# Patient Record
Sex: Female | Born: 1990 | Hispanic: Yes | Marital: Married | Smoking: Former smoker
Health system: Southern US, Community
[De-identification: ages and names within clinical notes are randomized; demographics above are authoritative.]

## PROBLEM LIST (undated history)

## (undated) DIAGNOSIS — O98819 Other maternal infectious and parasitic diseases complicating pregnancy, unspecified trimester: Secondary | ICD-10-CM

## (undated) DIAGNOSIS — Z789 Other specified health status: Secondary | ICD-10-CM

## (undated) DIAGNOSIS — A749 Chlamydial infection, unspecified: Secondary | ICD-10-CM

## (undated) HISTORY — PX: NO PAST SURGERIES: SHX2092

## (undated) HISTORY — PX: WISDOM TOOTH EXTRACTION: SHX21

## (undated) HISTORY — DX: Other specified health status: Z78.9

---

## 2014-01-13 ENCOUNTER — Encounter: Payer: Self-pay | Admitting: Obstetrics and Gynecology

## 2014-02-26 ENCOUNTER — Observation Stay: Payer: Self-pay | Admitting: Obstetrics & Gynecology

## 2014-02-27 ENCOUNTER — Inpatient Hospital Stay: Payer: Self-pay | Admitting: Obstetrics and Gynecology

## 2014-02-27 LAB — CBC WITH DIFFERENTIAL/PLATELET
Basophil #: 0.1 10*3/uL (ref 0.0–0.1)
Basophil %: 0.6 %
EOS ABS: 0 10*3/uL (ref 0.0–0.7)
EOS PCT: 0.2 %
HCT: 38.9 % (ref 35.0–47.0)
HGB: 12.7 g/dL (ref 12.0–16.0)
LYMPHS ABS: 1.5 10*3/uL (ref 1.0–3.6)
Lymphocyte %: 10.5 %
MCH: 29.7 pg (ref 26.0–34.0)
MCHC: 32.8 g/dL (ref 32.0–36.0)
MCV: 91 fL (ref 80–100)
Monocyte #: 0.7 x10 3/mm (ref 0.2–0.9)
Monocyte %: 5.1 %
NEUTROS ABS: 12.2 10*3/uL — AB (ref 1.4–6.5)
Neutrophil %: 83.6 %
Platelet: 207 10*3/uL (ref 150–440)
RBC: 4.3 10*6/uL (ref 3.80–5.20)
RDW: 14.4 % (ref 11.5–14.5)
WBC: 14.6 10*3/uL — ABNORMAL HIGH (ref 3.6–11.0)

## 2014-02-28 LAB — GC/CHLAMYDIA PROBE AMP

## 2014-02-28 LAB — HEMATOCRIT: HCT: 35.8 % (ref 35.0–47.0)

## 2014-03-01 LAB — DRUG SCREEN, URINE

## 2015-04-14 NOTE — H&P (Signed)
L&D Evaluation:  History:  HPI 24 yo G1 at 9660w4d by 33wk US derived EDC of 03/02/2014 presenting wtih clear SROM and 7cm cervical dilation.  No VB, +FM   Presents with contractions, leaking fluid   Patient's Medical History No Chronic Illness   Patient's Surgical History none   Medications Pre Natal Vitamins   Allergies shrimp   Social History EtOH   Family History Non-Contributory   ROS:  ROS All systems were reviewed.  HEENT, CNS, GI, GU, Respiratory, CV, Renal and Musculoskeletal systems were found to be normal.   Exam:  Vital Signs stable   Urine Protein not completed   General no apparent distress   Mental Status clear   Abdomen gravid, tender with contractions   Estimated Fetal Weight Average for gestational age   Fetal Position vtx   Back no CVAT   Edema no edema   Pelvic no external lesions, 7cm thick anterior lip 0 station   Mebranes Ruptured   Description clear   FHT normal rate with no decels   Ucx regular   Impression:  Impression active labor   Plan:  Plan EFM/NST, monitor contractions and for cervical change   Comments 1) Labor - expectant management  2) Fetus - category II tracing  3) PNL O positive / ABSC neg / RI / VZI / HBsAg neg / RPR NR / HIV neg / Hgb AA / 1-hr OGTT 97 / GC & CT neg & neg / GBS negative  4) TDAP given 01/03/14  5) THC and EtOH exposure this pregnancy, late entry to care      - UDS on admission  6) Disposition - pending delivery anticipate vaginal   Electronic Signatures: Lorrene ReidStaebler, Mafalda Mcginniss M (MD)  (Signed 26-Mar-15 23:36)  Authored: L&D Evaluation   Last Updated: 26-Mar-15 23:36 by Lorrene ReidStaebler, Nyree Yonker M (MD)

## 2015-06-21 ENCOUNTER — Emergency Department
Admission: EM | Admit: 2015-06-21 | Discharge: 2015-06-21 | Disposition: A | Discharge status: Home / Self Care | Payer: Medicaid Other | Attending: Emergency Medicine | Admitting: Emergency Medicine

## 2015-06-21 ENCOUNTER — Encounter: Payer: Self-pay | Admitting: Emergency Medicine

## 2015-06-21 DIAGNOSIS — J020 Streptococcal pharyngitis: Secondary | ICD-10-CM | POA: Insufficient documentation

## 2015-06-21 DIAGNOSIS — J029 Acute pharyngitis, unspecified: Secondary | ICD-10-CM | POA: Diagnosis present

## 2015-06-21 DIAGNOSIS — R21 Rash and other nonspecific skin eruption: Secondary | ICD-10-CM | POA: Insufficient documentation

## 2015-06-21 DIAGNOSIS — Z720 Tobacco use: Secondary | ICD-10-CM | POA: Insufficient documentation

## 2015-06-21 MED ORDER — LIDOCAINE VISCOUS 2 % MT SOLN: 20.0000 mL | OROMUCOSAL | Status: DC | PRN

## 2015-06-21 MED ORDER — IBUPROFEN 600 MG PO TABS: 600.0000 mg | ORAL_TABLET | Freq: Four times a day (QID) | ORAL | Status: DC | PRN

## 2015-06-21 MED ORDER — PENICILLIN G BENZATHINE 1200000 UNIT/2ML IM SUSP
1.2000 10*6.[IU] | Freq: Once | INTRAMUSCULAR | Status: AC
  Administered 2015-06-21: 1.2 10*6.[IU] via INTRAMUSCULAR
  Filled 2015-06-21: qty 2

## 2015-06-21 NOTE — ED Notes (Signed)
Pt reports sore throat since Thursday. Denies fever. Red rash noted over body.

## 2015-06-21 NOTE — Discharge Instructions (Signed)
Scarlet Fever °Scarlet fever is an infectious disease that can develop with a strep throat. It usually occurs in school-age children and can spread from person to person (contagious). Scarlet fever seldom causes any long-term problems.  °CAUSES °Scarlet fever is caused by the bacteria (Streptococcus pyogenes).  °SYMPTOMS °· Sore throat, fever, and headache. °· Mild abdominal pain. °· Tongue may become red (strawberry tongue). °· Red rash that starts 1 to 2 days after fever begins. Rash starts on face and spreads to rest of body. °· Rash looks and feels like "goose bumps" or sandpaper and may itch. °· Rash lasts 3 to 7 days and then starts to peel. Peeling may last 2 weeks. °DIAGNOSIS °Scarlet fever typically is diagnosed by physical exam and throat culture. Rapid strep testing is often available. °TREATMENT °Antibiotic medicine will be prescribed. It usually takes 24 to 48 hours after beginning antibiotics to start feeling better.  °HOME CARE INSTRUCTIONS °· Rest and get plenty of sleep. °· Take your antibiotics as directed. Finish them even if you start to feel better. °· Gargle a mixture of 1 tsp of salt and 8 oz of water to soothe the throat. °· Drink enough fluids to keep your urine clear or pale yellow. °· While the throat is very sore, eat soft or liquid foods such as milk, milk shakes, ice cream, frozen yogurts, soups, or instant breakfast milk drinks. Cold sport drinks, smoothies, or frozen ice pops are good choices for hydrating. °· Family members who develop a sore throat or fever should see a caregiver. °· Only take over-the-counter or prescription medicines for pain, discomfort, or fever as directed by your caregiver. Do not use aspirin. °· Follow up with your caregiver about test results if necessary. °SEEK MEDICAL CARE IF: °· There is no improvement even after 48 to 72 hours of treatment or the symptoms worsen. °· There is green, yellow-brown, or bloody phlegm. °· There is joint pain or leg  swelling. °· Paleness, weakness, and fast breathing develop. °· There is dry mouth, no urination, or sunken eyes (dehydration). °· There is dark brown or bloody urine. °SEEK IMMEDIATE MEDICAL CARE IF: °· There is drooling or swallowing problems. °· There are breathing problems. °· There is a voice change. °· There is neck pain. °MAKE SURE YOU:  °· Understand these instructions. °· Will watch your condition. °· Will get help right away if you are not doing well or get worse. °Document Released: 11/18/2000 Document Revised: 02/13/2012 Document Reviewed: 05/15/2011 °ExitCare® Patient Information ©2015 [employer]. This information is not intended to replace advice given to you by your health care provider. Make sure you discuss any questions you have with your health care provider. ° °Strep Throat °Strep throat is an infection of the throat caused by a bacteria named Streptococcus pyogenes. Your health care provider may call the infection streptococcal "tonsillitis" or "pharyngitis" depending on whether there are signs of inflammation in the tonsils or back of the throat. Strep throat is most common in children aged 5-15 years during the cold months of the year, but it can occur in people of any age during any season. This infection is spread from person to person (contagious) through coughing, sneezing, or other close contact. °SIGNS AND SYMPTOMS  °· Fever or chills. °· Painful, swollen, red tonsils or throat. °· Pain or difficulty when swallowing. °· White or yellow spots on the tonsils or throat. °· Swollen, tender lymph nodes or "glands" of the neck or under the jaw. °· Red rash   all over the body (rare). °DIAGNOSIS  °Many different infections can cause the same symptoms. A test must be done to confirm the diagnosis so the right treatment can be given. A "rapid strep test" can help your health care provider make the diagnosis in a few minutes. If this test is not available, a light swab of the infected area can be  used for a throat culture test. If a throat culture test is done, results are usually available in a day or two. °TREATMENT  °Strep throat is treated with antibiotic medicine. °HOME CARE INSTRUCTIONS  °· Gargle with 1 tsp of salt in 1 cup of warm water, 3-4 times per day or as needed for comfort. °· Family members who also have a sore throat or fever should be tested for strep throat and treated with antibiotics if they have the strep infection. °· Make sure everyone in your household washes their hands well. °· Do not share food, drinking cups, or personal items that could cause the infection to spread to others. °· You may need to eat a soft food diet until your sore throat gets better. °· Drink enough water and fluids to keep your urine clear or pale yellow. This will help prevent dehydration. °· Get plenty of rest. °· Stay home from school, day care, or work until you have been on antibiotics for 24 hours. °· Take medicines only as directed by your health care provider. °· Take your antibiotic medicine as directed by your health care provider. Finish it even if you start to feel better. °SEEK MEDICAL CARE IF:  °· The glands in your neck continue to enlarge. °· You develop a rash, cough, or earache. °· You cough up green, yellow-brown, or bloody sputum. °· You have pain or discomfort not controlled by medicines. °· Your problems seem to be getting worse rather than better. °· You have a fever. °SEEK IMMEDIATE MEDICAL CARE IF:  °· You develop any new symptoms such as vomiting, severe headache, stiff or painful neck, chest pain, shortness of breath, or trouble swallowing. °· You develop severe throat pain, drooling, or changes in your voice. °· You develop swelling of the neck, or the skin on the neck becomes red and tender. °· You develop signs of dehydration, such as fatigue, dry mouth, and decreased urination. °· You become increasingly sleepy, or you cannot wake up completely. °MAKE SURE YOU: °· Understand  these instructions. °· Will watch your condition. °· Will get help right away if you are not doing well or get worse. °Document Released: 11/18/2000 Document Revised: 04/07/2014 Document Reviewed: 01/20/2011 °ExitCare® Patient Information ©2015 [employer]. This information is not intended to replace advice given to you by your health care provider. Make sure you discuss any questions you have with your health care provider. ° °

## 2015-06-21 NOTE — ED Notes (Signed)
Pt reports understanding of discharge instructions  

## 2015-06-21 NOTE — ED Provider Notes (Signed)
[facility] Emergency Department Provider Note  ____________________________________________  Time seen: Approximately 1:41 PM  I have reviewed the triage vital signs and the nursing notes.   HISTORY  Chief Complaint Sore Throat    HPI Cassandra Dixon is a 24 y.o. female Cassandra Dixon with a three-day history of extremely sore throat and newly developed rash on her body. Patient states that hurts to swallow. Denies any fever chills or body aches.   History reviewed. No pertinent past medical history.  There are no active problems to display for this patient.   History reviewed. No pertinent past surgical history.  Current Outpatient Rx  Name  Route  Sig  Dispense  Refill  . ibuprofen (ADVIL,MOTRIN) 600 MG tablet   Oral   Take 1 tablet (600 mg total) by mouth every 6 (six) hours as needed.   30 tablet   0   . lidocaine (XYLOCAINE) 2 % solution   Mouth/Throat   Use as directed 20 mLs in the mouth or throat as needed for mouth pain.   100 mL   0     Allergies Review of patient's allergies indicates no known allergies.  No family history on file.  Social History History  Substance Use Topics  . Smoking status: Light Tobacco Smoker  . Smokeless tobacco: Never Used  . Alcohol Use: No    Review of Systems Constitutional: No fever/chills Eyes: No visual changes. ENT: Positive sore throat. Cardiovascular: Denies chest pain. Respiratory: Denies shortness of breath. Gastrointestinal: No abdominal pain.  No nausea, no vomiting.  No diarrhea.  No constipation. Genitourinary: Negative for dysuria. Musculoskeletal: Negative for back pain. Skin: Positive for rash.. Neurological: Negative for headaches, focal weakness or numbness.  10-point ROS otherwise negative.  ____________________________________________   PHYSICAL EXAM:  VITAL SIGNS: ED Triage Vitals  Enc Vitals Group     BP 06/21/15 1306 123/73 mmHg     Pulse Rate 06/21/15 1306 91    Resp 06/21/15 1306 18     Temp 06/21/15 1306 98.3 F (36.8 C)     Temp Source 06/21/15 1306 Oral     SpO2 06/21/15 1306 97 %     Weight 06/21/15 1306 160 lb (72.576 kg)     Height 06/21/15 1306  (1.6 m)     Head Cir --      Peak Flow --      Pain Score 06/21/15 1309 8     Pain Loc --      Pain Edu? --      Excl. in GC? --     Constitutional: Alert and oriented. Well appearing and in no acute distress. Eyes: Conjunctivae are normal. PERRL. EOMI. Head: Atraumatic. Nose: No congestion/rhinnorhea. Mouth/Throat: Mucous membranes are moist.  Oropharynx very erythematous and raw. Neck: No stridor.   Cardiovascular: Normal rate, regular rhythm. Grossly normal heart sounds.  Good peripheral circulation. Respiratory: Normal respiratory effort.  No retractions. Lungs CTAB. Gastrointestinal: Soft and nontender. No distention. No abdominal bruits. No CVA tenderness. Musculoskeletal: No lower extremity tenderness nor edema.  No joint effusions. Neurologic:  Normal speech and language. No gross focal neurologic deficits are appreciated. No gait instability. Skin:  Skin is warm, dry and intact. Positive scarlatiniform rash noted on arms trunk and legs. Psychiatric: Mood and affect are normal. Speech and behavior are normal.  ____________________________________________   LABS (all labs ordered are listed, but only abnormal results are displayed)  Labs Reviewed - No data to display   PROCEDURES  Procedure(s) performed: None  Critical Care performed: No  ____________________________________________   INITIAL IMPRESSION / ASSESSMENT AND PLAN / ED COURSE  Pertinent labs & imaging results that were available during my care of the patient were reviewed by me and considered in my medical decision making (see chart for details).  Acute tonsillitis/strep throat pharyngitis. Rx given for Bicillin G1.2 milliunits IM Rx for viscous lidocaine given encouraged Tylenol and ibuprofen for  pain. Patient voices understanding and will return to the ER with any worsening symptomology. She expresses no other emergency medical complaints at this visit. ____________________________________________   FINAL CLINICAL IMPRESSION(S) / ED DIAGNOSES  Final diagnoses:  Pharyngitis, streptococcal, acute      Evangeline DakinCharles M Beers, PA-C 06/21/15 1401  Darci Currentandolph N Brown, MD 06/23/15 [phone]

## 2015-12-06 DIAGNOSIS — A749 Chlamydial infection, unspecified: Secondary | ICD-10-CM

## 2015-12-06 HISTORY — DX: Chlamydial infection, unspecified: A74.9

## 2015-12-06 NOTE — L&D Delivery Note (Signed)
  Obstetrical Delivery Note   Date of Delivery:   09/11/2016 Primary OB:   [facility] Gestational Age/EDD: 5763w1d (Dated by 13 week 6 day ultrasound) Antepartum complications: chlamydia and marijuana use in early pregnancy  Delivered By:   Farrel ConnersGUTIERREZ, Chyla Schlender, CNM  Delivery Type:   spontaneous vaginal delivery  Procedure Details:   Pt. Arrived to unit c/c/+2 and urge to push.  SVD of viable female infant at 1440 over intact perineum.  Shoulders easily delivered with mom lying on right side.  Infant with spontaneous cry, dried and stimulated on mom's chest.  Cord clamped x 2 after pulsation stopped, then cut.  Apgars 8/9.  Spontaneous delivery of the placenta, intact with 3VC.  Uterus firm and U-2 with mild rubra lochia, no clots.  Perineum, vagina, side walls and cervix inspected and intact.  EBL approximately 300 ml.  Mom and infant stable in LDR. Anesthesia:    none Intrapartum complications: None GBS:    Positive- untreated Laceration:    none Episiotomy:    none Placenta:    Via active 3rd stage. To pathology: no Estimated Blood Loss:  350ml  Baby:    Liveborn female, Apgars 8/9    Deane Melick, CNM Leotis Shames/Lauren Zachery ConchFriedman SNM

## 2016-02-02 IMAGING — US US OB DETAIL+14 WK - NRPT [facility]
1 series · 14 of 28 positions shown · non-contrast
Comparison: none

[Series 1: us ob detail+14 wk - nrpt [facility] · 0.28mm/px · 14 of 299 slices shown]
[im 12/299]
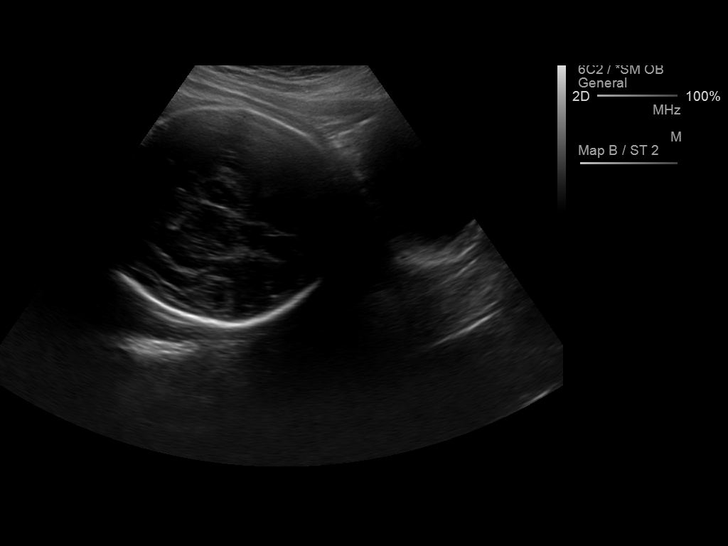
[im 34/299]
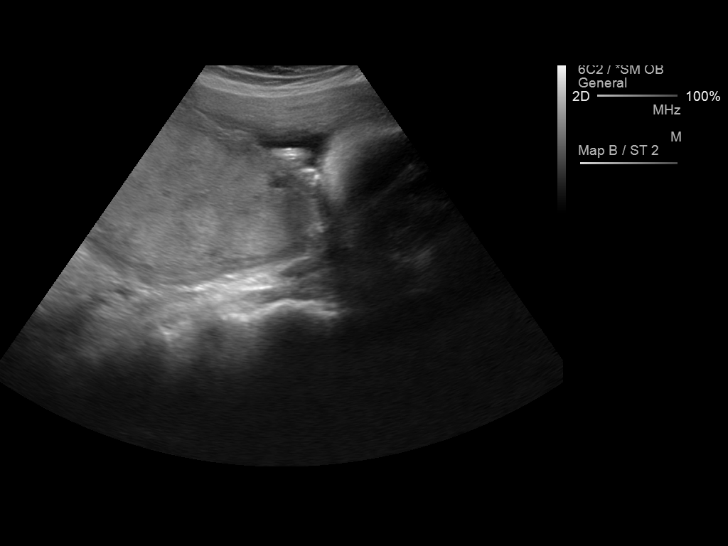
[im 56/299]
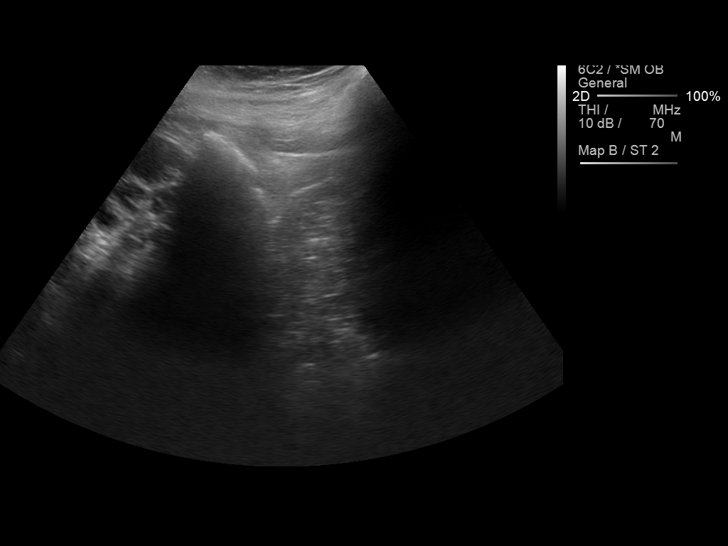
[im 78/299]
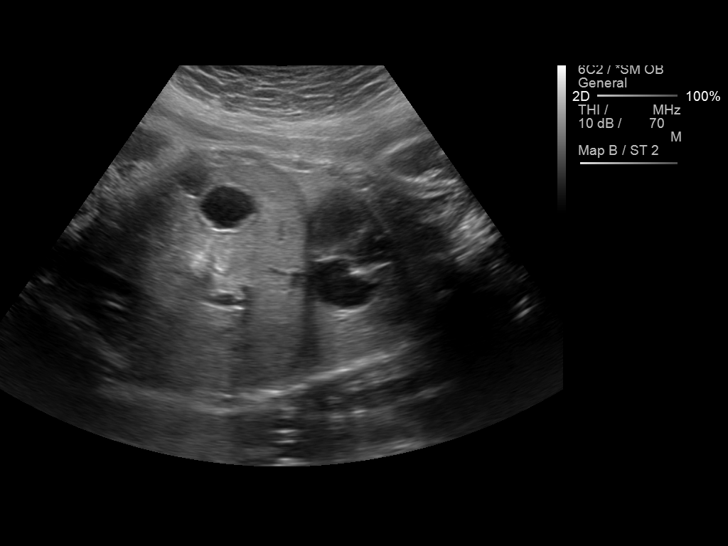
[im 100/299]
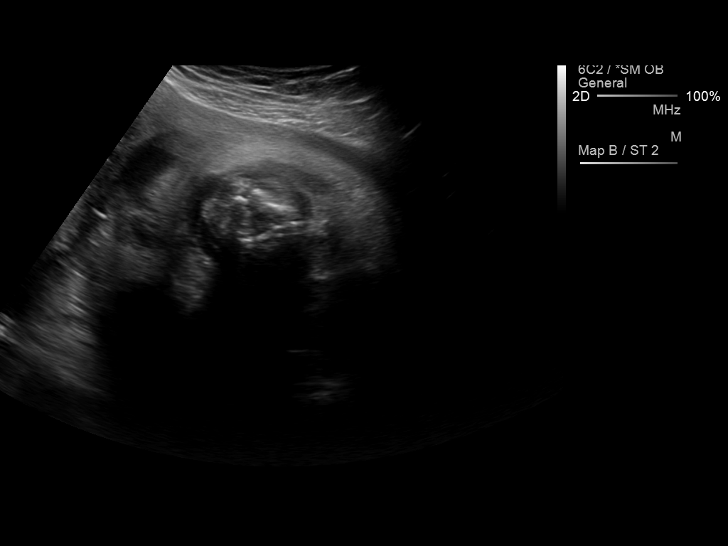
[im 122/299]
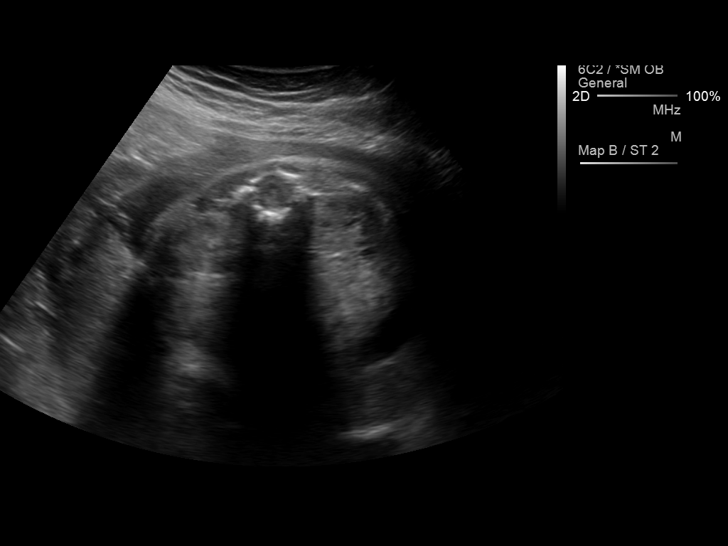
[im 144/299]
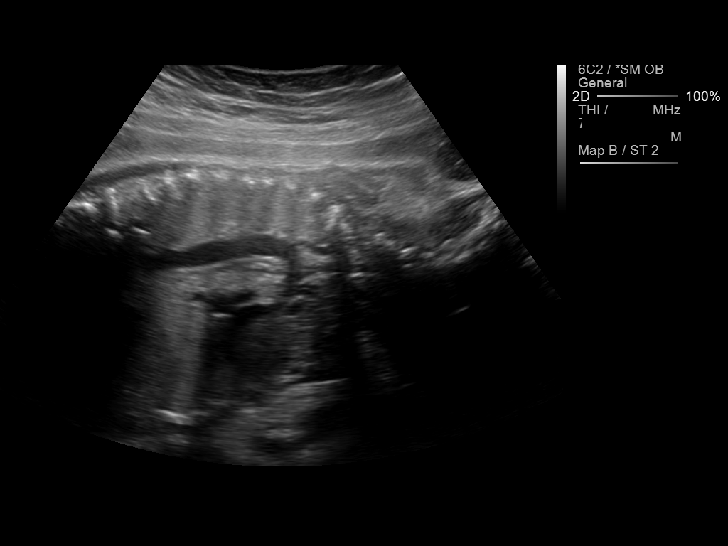
[im 166/299]
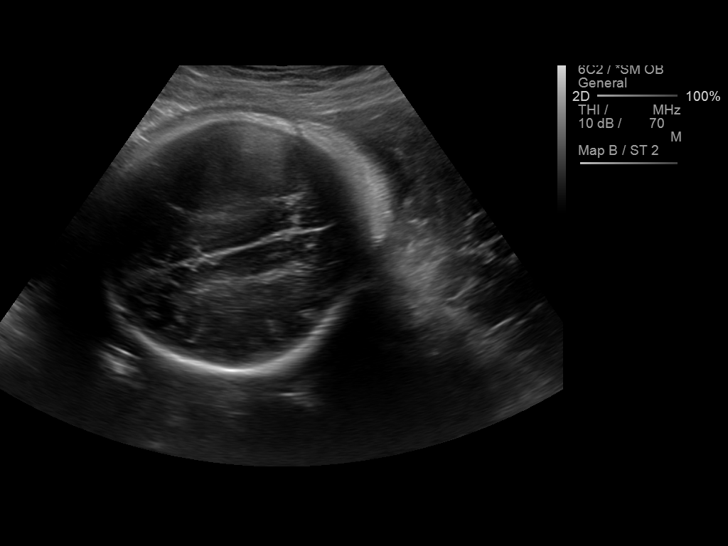
[im 188/299]
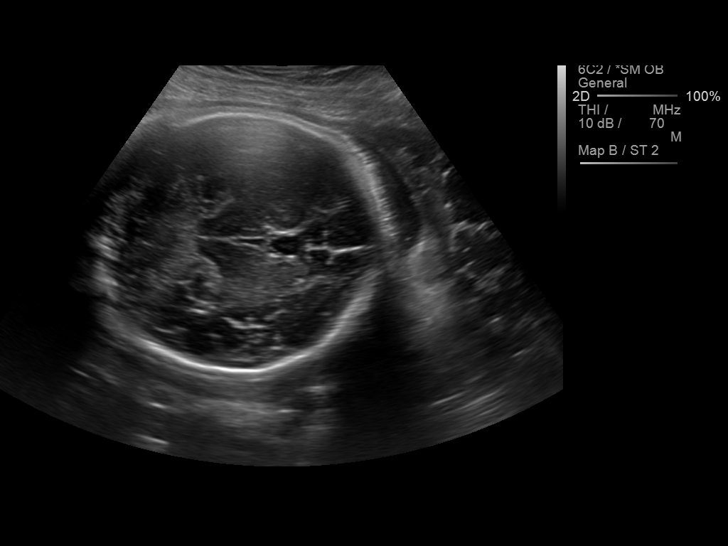
[im 210/299]
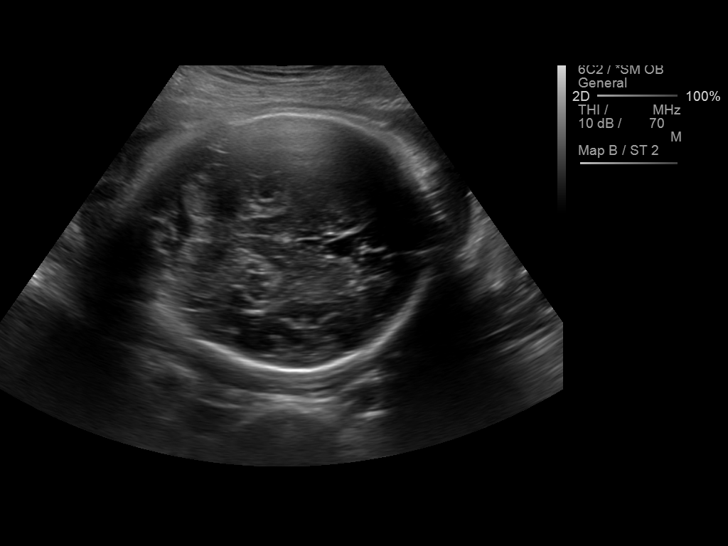
[im 232/299]
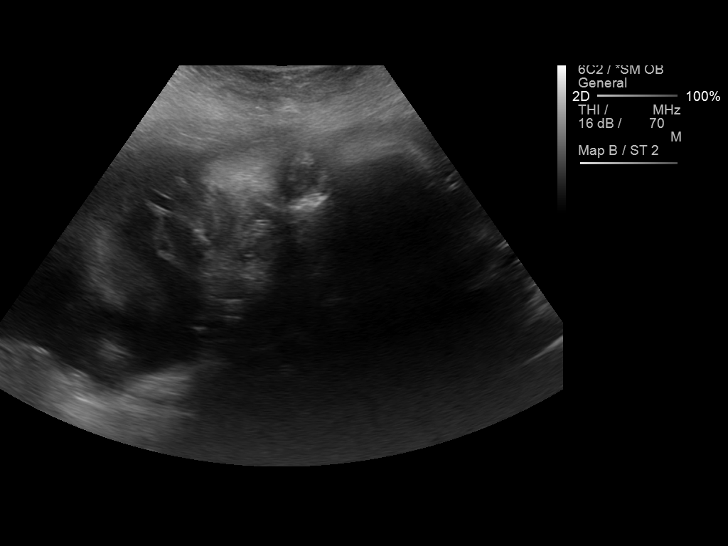
[im 254/299]
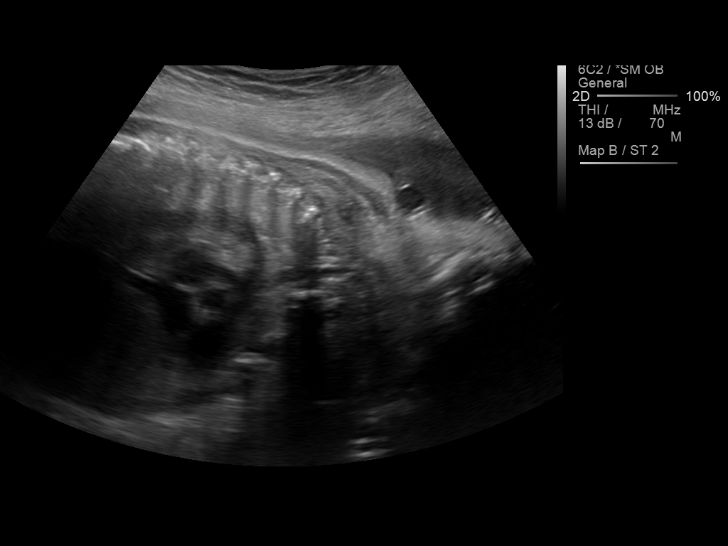
[im 276/299]
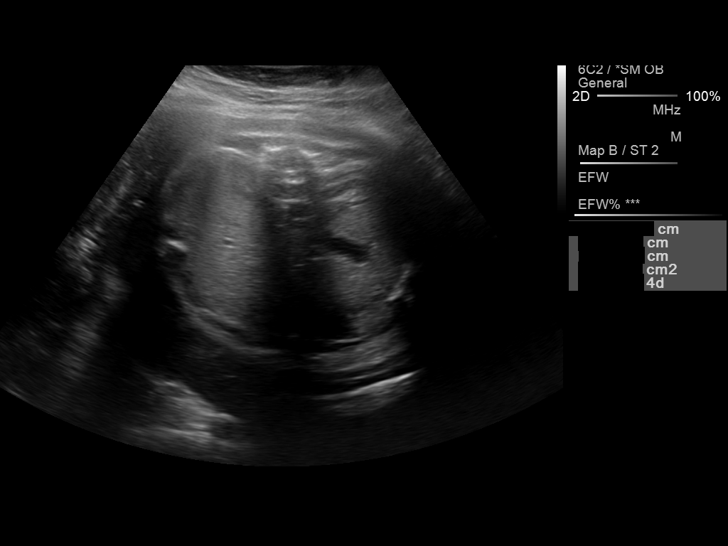
[im 299/299]
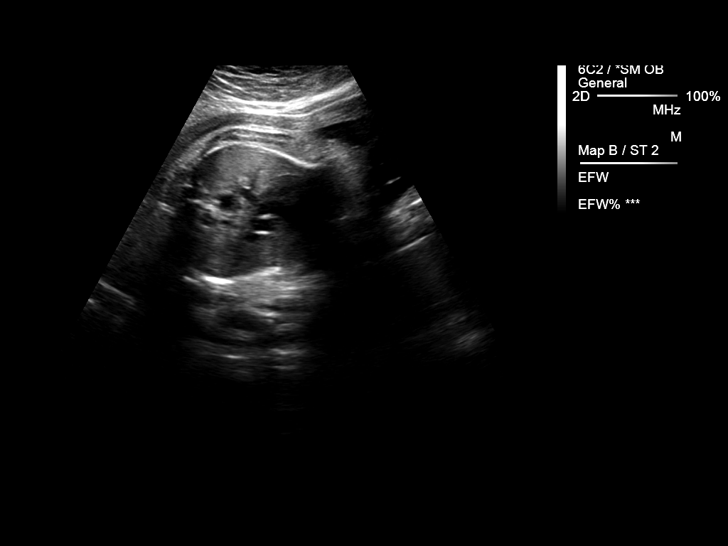

[14 of 28 positions shown; findings below may reference images not displayed]

IMAGES IMPORTED FROM THE [employer] WORKFLOW SYSTEM
NO DICTATION FOR STUDY

## 2016-03-17 LAB — HM PAP SMEAR: HM Pap smear: NORMAL

## 2016-03-24 LAB — OB RESULTS CONSOLE GC/CHLAMYDIA: CHLAMYDIA, DNA PROBE: POSITIVE

## 2016-03-25 LAB — OB RESULTS CONSOLE ANTIBODY SCREEN: Antibody Screen: NEGATIVE

## 2016-03-25 LAB — OB RESULTS CONSOLE VARICELLA ZOSTER ANTIBODY, IGG: VARICELLA IGG: IMMUNE

## 2016-03-25 LAB — OB RESULTS CONSOLE ABO/RH: RH TYPE: POSITIVE

## 2016-03-25 LAB — OB RESULTS CONSOLE RPR: RPR: NONREACTIVE

## 2016-03-25 LAB — OB RESULTS CONSOLE HIV ANTIBODY (ROUTINE TESTING): HIV: NONREACTIVE

## 2016-03-25 LAB — OB RESULTS CONSOLE HEPATITIS B SURFACE ANTIGEN: Hepatitis B Surface Ag: NEGATIVE

## 2016-03-25 LAB — OB RESULTS CONSOLE RUBELLA ANTIBODY, IGM: RUBELLA: IMMUNE

## 2016-04-20 LAB — OB RESULTS CONSOLE GC/CHLAMYDIA: Chlamydia: NEGATIVE

## 2016-07-12 ENCOUNTER — Encounter: Payer: Self-pay | Admitting: Emergency Medicine

## 2016-07-12 ENCOUNTER — Emergency Department
Admission: EM | Admit: 2016-07-12 | Discharge: 2016-07-12 | Disposition: A | Discharge status: Home / Self Care | Payer: Medicaid Other | Attending: Emergency Medicine | Admitting: Emergency Medicine

## 2016-07-12 DIAGNOSIS — O26893 Other specified pregnancy related conditions, third trimester: Secondary | ICD-10-CM | POA: Diagnosis present

## 2016-07-12 DIAGNOSIS — O99333 Smoking (tobacco) complicating pregnancy, third trimester: Secondary | ICD-10-CM | POA: Diagnosis not present

## 2016-07-12 DIAGNOSIS — Z3A29 29 weeks gestation of pregnancy: Secondary | ICD-10-CM | POA: Insufficient documentation

## 2016-07-12 DIAGNOSIS — F17200 Nicotine dependence, unspecified, uncomplicated: Secondary | ICD-10-CM | POA: Insufficient documentation

## 2016-07-12 DIAGNOSIS — L299 Pruritus, unspecified: Secondary | ICD-10-CM | POA: Diagnosis not present

## 2016-07-12 DIAGNOSIS — Z791 Long term (current) use of non-steroidal anti-inflammatories (NSAID): Secondary | ICD-10-CM | POA: Diagnosis not present

## 2016-07-12 DIAGNOSIS — O99713 Diseases of the skin and subcutaneous tissue complicating pregnancy, third trimester: Secondary | ICD-10-CM | POA: Diagnosis not present

## 2016-07-12 DIAGNOSIS — L282 Other prurigo: Secondary | ICD-10-CM

## 2016-07-12 LAB — COMPREHENSIVE METABOLIC PANEL
ALT: 12 U/L — AB (ref 14–54)
ANION GAP: 6 (ref 5–15)
AST: 18 U/L (ref 15–41)
Albumin: 3.4 g/dL — ABNORMAL LOW (ref 3.5–5.0)
Alkaline Phosphatase: 65 U/L (ref 38–126)
BUN: 9 mg/dL (ref 6–20)
CALCIUM: 8.4 mg/dL — AB (ref 8.9–10.3)
CHLORIDE: 106 mmol/L (ref 101–111)
CO2: 25 mmol/L (ref 22–32)
CREATININE: 0.45 mg/dL (ref 0.44–1.00)
Glucose, Bld: 91 mg/dL (ref 65–99)
Potassium: 3.5 mmol/L (ref 3.5–5.1)
SODIUM: 137 mmol/L (ref 135–145)
Total Bilirubin: 0.3 mg/dL (ref 0.3–1.2)
Total Protein: 6.8 g/dL (ref 6.5–8.1)

## 2016-07-12 LAB — CBC WITH DIFFERENTIAL/PLATELET
BASOS PCT: 1 %
Basophils Absolute: 0.1 10*3/uL (ref 0–0.1)
EOS ABS: 0.2 10*3/uL (ref 0–0.7)
Eosinophils Relative: 2 %
HCT: 37.1 % (ref 35.0–47.0)
Hemoglobin: 13.1 g/dL (ref 12.0–16.0)
LYMPHS ABS: 1.8 10*3/uL (ref 1.0–3.6)
Lymphocytes Relative: 21 %
MCH: 31.7 pg (ref 26.0–34.0)
MCHC: 35.3 g/dL (ref 32.0–36.0)
MCV: 89.8 fL (ref 80.0–100.0)
MONO ABS: 0.6 10*3/uL (ref 0.2–0.9)
MONOS PCT: 6 %
NEUTROS PCT: 70 %
Neutro Abs: 6.1 10*3/uL (ref 1.4–6.5)
PLATELETS: 223 10*3/uL (ref 150–440)
RBC: 4.13 MIL/uL (ref 3.80–5.20)
RDW: 13.5 % (ref 11.5–14.5)
WBC: 8.7 10*3/uL (ref 3.6–11.0)

## 2016-07-12 LAB — LIPASE, BLOOD: LIPASE: 22 U/L (ref 11–51)

## 2016-07-12 MED ORDER — DIPHENHYDRAMINE HCL 50 MG/ML IJ SOLN
12.5000 mg | Freq: Once | INTRAMUSCULAR | Status: AC
  Administered 2016-07-12: 12.5 mg via INTRAVENOUS
  Filled 2016-07-12: qty 1

## 2016-07-12 NOTE — Discharge Instructions (Signed)
As we discussed, the OB/GYN doctor (Dr. Bonney AidStaebler) suggested that you do not eat or drink anything this morning.  Call [facility] at about 8:00am (or when they open) and explain that Dr. Bonney AidStaebler told the ER doctor that you should have an appointment this morning to check your fasting bile acids to see if that is what it making you itch.  You can use over-the-counter Benadryl for the itching, but since you are pregnant, it would be best to minimize its use.  The providers [facility] may offer additional suggestions to manage your symptoms.

## 2016-07-12 NOTE — ED Provider Notes (Signed)
[facility] Emergency Department Provider Note  ____________________________________________   First MD Initiated Contact with Patient 07/12/16 [phone]     (approximate)  I have reviewed the triage vital signs and the nursing notes.   HISTORY  Chief Complaint Rash    HPI Cassandra Dixon is a 25 y.o. female  G2 P1 at approximately 5329 weeks gestation who [facility].  She presents for evaluation of severe itching over most of her body.  She reports that it started days ago and that is gradually gotten worse.  She is not sure where it started but now all 4 extremities and her chest and abdomen are both covered with a fine erythematous rash that is severely pruritic.  She states it is not painful.  She has not taken any medications because she is pregnant and does not want to take the wrong thing.  Nothing makes it better and is gradually getting worse.  She denies fever/chills, chest pain, shortness of breath, nausea, vomiting, diarrhea, vaginal bleeding, vaginal discharge.  She states that she has not changed any cleaning products or bath products recently.  She has no allergies of which she is aware.   History reviewed. No pertinent past medical history.  There are no active problems to display for this patient.   History reviewed. No pertinent surgical history.  Prior to Admission medications   Medication Sig Start Date End Date Taking? Authorizing Provider  ibuprofen (ADVIL,MOTRIN) 600 MG tablet Take 1 tablet (600 mg total) by mouth every 6 (six) hours as needed. 06/21/15   Charmayne Sheerharles M Beers, PA-C  lidocaine (XYLOCAINE) 2 % solution Use as directed 20 mLs in the mouth or throat as needed for mouth pain. 06/21/15   Evangeline Dakinharles M Beers, PA-C    Allergies Review of patient's allergies indicates no known allergies.  No family history on file.  Social History Social History  Substance Use Topics  . Smoking status: Light Tobacco Smoker    . Smokeless tobacco: Never Used  . Alcohol use Not on file    Review of Systems Constitutional: No fever/chills Eyes: No visual changes. ENT: No sore throat. Cardiovascular: Denies chest pain. Respiratory: Denies shortness of breath. Gastrointestinal: No abdominal pain.  No nausea, no vomiting.  No diarrhea.  No constipation. Genitourinary: Negative for dysuria. Musculoskeletal: Negative for back pain. Skin: Itching red rash over most skin surfaces Neurological: Negative for headaches, focal weakness or numbness.  10-point ROS otherwise negative.  ____________________________________________   PHYSICAL EXAM:  VITAL SIGNS: ED Triage Vitals [07/12/16 0157]  Enc Vitals Group     BP 105/69     Pulse Rate 68     Resp 18     Temp 97.9 F (36.6 C)     Temp Source Oral     SpO2 100 %     Weight 160 lb (72.6 kg)     Height 5\' 2"  (1.575 m)     Head Circumference      Peak Flow      Pain Score      Pain Loc      Pain Edu?      Excl. in GC?     Constitutional: Alert and oriented. Well appearing and in no acute distress. Eyes: Conjunctivae are normal. PERRL. EOMI. Head: Atraumatic. Nose: No congestion/rhinnorhea. Mouth/Throat: Mucous membranes are moist.  Oropharynx non-erythematous. Neck: No stridor.  No meningeal signs.   Cardiovascular: Normal rate, regular rhythm. Good peripheral circulation. Grossly  normal heart sounds.   Respiratory: Normal respiratory effort.  No retractions. Lungs CTAB. Gastrointestinal: Soft and nontender. No distention.  Musculoskeletal: No lower extremity tenderness nor edema. No gross deformities of extremities. Neurologic:  Normal speech and language. No gross focal neurologic deficits are appreciated.  Skin:  Skin is warm, dry and intact.  Fine sandpaper like papular rash over arms, legs, and torso and abdomen.  No plaques visible.  No cellulitis or open wounds. Psychiatric: Mood and affect are normal. Speech and behavior are  normal.  ____________________________________________   LABS (all labs ordered are listed, but only abnormal results are displayed)  Labs Reviewed  COMPREHENSIVE METABOLIC PANEL - Abnormal; Notable for the following:       Result Value   Calcium 8.4 (*)    Albumin 3.4 (*)    ALT 12 (*)    All other components within normal limits  CBC WITH DIFFERENTIAL/PLATELET  LIPASE, BLOOD   ____________________________________________  EKG  None ____________________________________________  RADIOLOGY   No results found.  ____________________________________________   PROCEDURES  Procedure(s) performed:   Procedures   Critical Care performed: No ____________________________________________   INITIAL IMPRESSION / ASSESSMENT AND PLAN / ED COURSE  Pertinent labs & imaging results that were available during my care of the patient were reviewed by me and considered in my medical decision making (see chart for details).  Allergic reaction most likely, but unknown source.  Cholestasis of pregnancy versus PUPPS also possible.  Checking basic labs to determine if she has an elevated bilirubin or alkaline phosphatase.  I spoke by phone with Dr. Bonney Aid and asked what was the safest/recommended medication for itching during pregnancy and he recommended Benadryl as opposed to hydroxyzine.  Most likely the patient will follow up in the morning at [facility] to have fasting bile acid labs drawn.  Clinical Course  Comment By Time  Patient's itching somewhat improved.  Labs unremarkable.  Still mild rash, but less erythematous than before.  Relayed the information from Dr. Bonney Aid that she should remain NPO and call the clinic to be seen this morning for fasting labs to check bile acids.  Gave usual/customary return precautions. Loleta Rose, MD 08/08 [phone]    ____________________________________________  FINAL CLINICAL IMPRESSION(S) / ED DIAGNOSES  Final diagnoses:  Pruritic rash      MEDICATIONS GIVEN DURING THIS VISIT:  Medications  diphenhydrAMINE (BENADRYL) injection 12.5 mg (12.5 mg Intravenous Given 07/12/16 0452)     NEW OUTPATIENT MEDICATIONS STARTED DURING THIS VISIT:  New Prescriptions   No medications on file      Note:  This document was prepared using Dragon voice recognition software and may include unintentional dictation errors.   Loleta Rose, MD 07/12/16 [phone]

## 2016-07-12 NOTE — ED Triage Notes (Addendum)
Patient ambulatory to triage with steady gait, without difficulty or distress noted; pt reports generalized itching since yesterday with no known cause; [redacted]wks pregnant; FHTs 160 and regular to left mid abd

## 2016-08-31 LAB — OB RESULTS CONSOLE GBS: GBS: POSITIVE

## 2016-09-11 ENCOUNTER — Encounter: Payer: Self-pay | Admitting: Certified Nurse Midwife

## 2016-09-11 ENCOUNTER — Inpatient Hospital Stay
Admission: EM | Admit: 2016-09-11 | Discharge: 2016-09-13 | DRG: 775 | Disposition: A | Discharge status: Home / Self Care | Payer: Medicaid Other | Attending: Certified Nurse Midwife | Admitting: Certified Nurse Midwife

## 2016-09-11 DIAGNOSIS — O99334 Smoking (tobacco) complicating childbirth: Secondary | ICD-10-CM | POA: Diagnosis present

## 2016-09-11 DIAGNOSIS — Z3A38 38 weeks gestation of pregnancy: Secondary | ICD-10-CM

## 2016-09-11 DIAGNOSIS — F1290 Cannabis use, unspecified, uncomplicated: Secondary | ICD-10-CM | POA: Diagnosis present

## 2016-09-11 DIAGNOSIS — Z3403 Encounter for supervision of normal first pregnancy, third trimester: Secondary | ICD-10-CM | POA: Diagnosis present

## 2016-09-11 DIAGNOSIS — O99324 Drug use complicating childbirth: Secondary | ICD-10-CM | POA: Diagnosis present

## 2016-09-11 DIAGNOSIS — O99824 Streptococcus B carrier state complicating childbirth: Secondary | ICD-10-CM | POA: Diagnosis present

## 2016-09-11 HISTORY — DX: Other maternal infectious and parasitic diseases complicating pregnancy, unspecified trimester: O98.819

## 2016-09-11 HISTORY — DX: Chlamydial infection, unspecified: A74.9

## 2016-09-11 LAB — URINE DRUG SCREEN, QUALITATIVE (ARMC ONLY)
AMPHETAMINES, UR SCREEN: NOT DETECTED
Barbiturates, Ur Screen: NOT DETECTED
Benzodiazepine, Ur Scrn: NOT DETECTED
Cannabinoid 50 Ng, Ur ~~LOC~~: NOT DETECTED
Cocaine Metabolite,Ur ~~LOC~~: NOT DETECTED
MDMA (ECSTASY) UR SCREEN: NOT DETECTED
Methadone Scn, Ur: NOT DETECTED
OPIATE, UR SCREEN: NOT DETECTED
PHENCYCLIDINE (PCP) UR S: NOT DETECTED
Tricyclic, Ur Screen: NOT DETECTED

## 2016-09-11 LAB — CHLAMYDIA/NGC RT PCR (ARMC ONLY)
Chlamydia Tr: NOT DETECTED
N gonorrhoeae: NOT DETECTED

## 2016-09-11 MED ORDER — OXYTOCIN 10 UNIT/ML IJ SOLN
INTRAMUSCULAR | Status: AC
  Filled 2016-09-11: qty 1

## 2016-09-11 MED ORDER — SIMETHICONE 80 MG PO CHEW: 80.0000 mg | CHEWABLE_TABLET | ORAL | Status: DC | PRN

## 2016-09-11 MED ORDER — OXYCODONE-ACETAMINOPHEN 5-325 MG PO TABS: 1.0000 | ORAL_TABLET | ORAL | Status: DC | PRN

## 2016-09-11 MED ORDER — IBUPROFEN 600 MG PO TABS
600.0000 mg | ORAL_TABLET | Freq: Four times a day (QID) | ORAL | Status: DC
  Administered 2016-09-11 – 2016-09-12 (×5): 600 mg via ORAL
  Filled 2016-09-11 (×5): qty 1

## 2016-09-11 MED ORDER — IBUPROFEN 600 MG PO TABS
ORAL_TABLET | ORAL | Status: AC
  Administered 2016-09-11: 600 mg via ORAL
  Filled 2016-09-11: qty 1

## 2016-09-11 MED ORDER — LIDOCAINE HCL (PF) 1 % IJ SOLN: 30.0000 mL | INTRAMUSCULAR | Status: DC | PRN

## 2016-09-11 MED ORDER — OXYCODONE-ACETAMINOPHEN 5-325 MG PO TABS: 2.0000 | ORAL_TABLET | ORAL | Status: DC | PRN

## 2016-09-11 MED ORDER — ONDANSETRON HCL 4 MG PO TABS: 4.0000 mg | ORAL_TABLET | ORAL | Status: DC | PRN

## 2016-09-11 MED ORDER — COCONUT OIL OIL
1.0000 "application " | TOPICAL_OIL | Status: DC | PRN
  Filled 2016-09-11: qty 120

## 2016-09-11 MED ORDER — ONDANSETRON HCL 4 MG/2ML IJ SOLN
4.0000 mg | INTRAMUSCULAR | Status: DC | PRN
Start: 2016-09-11 — End: 2016-09-13

## 2016-09-11 MED ORDER — FERROUS SULFATE 325 (65 FE) MG PO TABS
325.0000 mg | ORAL_TABLET | Freq: Every day | ORAL | Status: DC
  Administered 2016-09-12: 325 mg via ORAL
  Filled 2016-09-11: qty 1

## 2016-09-11 MED ORDER — OXYTOCIN 10 UNIT/ML IJ SOLN
10.0000 [IU] | Freq: Once | INTRAMUSCULAR | Status: AC
  Administered 2016-09-11: 10 [IU] via INTRAMUSCULAR

## 2016-09-11 MED ORDER — MEDROXYPROGESTERONE ACETATE 150 MG/ML IM SUSP: 150.0000 mg | INTRAMUSCULAR | Status: DC | PRN

## 2016-09-11 MED ORDER — BENZOCAINE-MENTHOL 20-0.5 % EX AERO: 1.0000 "application " | INHALATION_SPRAY | CUTANEOUS | Status: DC | PRN

## 2016-09-11 MED ORDER — WITCH HAZEL-GLYCERIN EX PADS: 1.0000 "application " | MEDICATED_PAD | CUTANEOUS | Status: DC | PRN

## 2016-09-11 MED ORDER — PRENATAL MULTIVITAMIN CH
1.0000 | ORAL_TABLET | Freq: Every day | ORAL | Status: DC
  Administered 2016-09-12: 1 via ORAL
  Filled 2016-09-11: qty 1

## 2016-09-11 MED ORDER — DIBUCAINE 1 % RE OINT: 1.0000 "application " | TOPICAL_OINTMENT | RECTAL | Status: DC | PRN

## 2016-09-11 NOTE — Discharge Summary (Signed)
Obstetric Discharge Summary Reason for Admission: Labor Prenatal Procedures: none Intrapartum Procedures: spontaneous vaginal delivery Postpartum Procedures: none Complications-Operative and Postpartum: none Hemoglobin  Date Value Ref Range Status  09/12/2016 13.4 12.0 - 16.0 g/dL Final   HGB  Date Value Ref Range Status  02/27/2014 12.7 12.0 - 16.0 g/dL Final   HCT  Date Value Ref Range Status  09/12/2016 39.1 35.0 - 47.0 % Final  02/28/2014 35.8 35.0 - 47.0 % Final   O+/ RI/ VI/ GBS positive (untreated) Tdap UTD  Physical Exam:  General: alert, cooperative and no distress Lochia: appropriate Uterine Fundus: Firm, ML/NT/ U-2 DVT Evaluation: No evidence of DVT seen on physical exam.  Discharge Diagnoses: Term Pregnancy-delivered  Discharge Information: Date: 09/13/2016 Activity: pelvic rest Diet: routine Medications: PNV and Ibuprofen Condition: stable Instructions: refer to practice specific booklet Discharge to: home Follow-up Information    GUTIERREZ, COLLEEN, CNM .   Specialty:  Certified Nurse Midwife Contact information: [address] [place] [id number] [phone]           Newborn Data: Live born female, Alyanna Birth Weight:  7# 3oz APGAR: 8, 9 Breast  Undecided about birth control, information provided in discharge instructions to review prior to 6 week appointment  Home with mother.  Marta AntuBrothers, Chelsey Kimberley, CNM/Lauren Zachery ConchFriedman SNM

## 2016-09-11 NOTE — ED Provider Notes (Signed)
   Time Seen: Approximately *1425 I have reviewed the triage notes  Chief Complaint: No chief complaint on file.   History of Present Illness: Cassandra Dixon is a 25 y.o. female who presents to emergency department at [redacted] weeks pregnant and what seems to be imminent delivery. Patient can't identify how far apart her contractions are denies any prolonged water. Patient appears to be in significant discomfort with her contractions. She has had one other previous delivery without complication. No past medical history on file.  There are no active problems to display for this patient.   No past surgical history on file.  No past surgical history on file.  Current Outpatient Rx  . Order #: [id number] Class: Print  . Order #: [id number] Class: Print    Allergies:  Review of patient's allergies indicates no known allergies.  Family History: No family history on file.  Social History: Social History  Substance Use Topics  . Smoking status: Light Tobacco Smoker  . Smokeless tobacco: Never Used  . Alcohol use Not on file     Review of Systems:   10 point review of systems was performed and was otherwise negative:  Constitutional: No fever Eyes: No visual disturbances ENT: No sore throat, ear pain Cardiac: No chest pain Respiratory: No shortness of breath, wheezing, or stridor Abdomen: Lower middle abdominal pain Endocrine: No weight loss, No night sweats Extremities: No peripheral edema, cyanosis Skin: No rashes, easy bruising Neurologic: No focal weakness, trouble with speech or swollowing Urologic: No dysuria, Hematuria, or urinary frequency   Physical Exam:  ED Triage Vitals  Enc Vitals Group     BP      Pulse      Resp      Temp      Temp src      SpO2      Weight      Height      Head Circumference      Peak Flow      Pain Score      Pain Loc      Pain Edu?      Excl. in GC?     General: Awake , Alert , and Oriented times 3; GCS 15 Significant  discomfort Abdomen: Appears somewhat of a third trimester pregnancy        Extremities: 2 plus symmetric pulses. No edema, clubbing or cyanosis Neurologic: normal ambulation, Motor symmetric without deficits, sensory intact Skin: warm, dry, no rashes Clinic bedside exam with nursing staff present shows the patient to be fully dilated in the head with approximately 3 cm inside the vaginal vault.   ED Course:  Patient was rushed to labor and delivery as the emergency department and notified Dr. Jean RosenthalJackson who [facility]. Patient was taken to labor and delivery area for the imminent delivery today on the stretcher. I contacted the patient to labor and delivery area and unfortunately numerous staff waiting on arrival. The patient went on to have a normal vaginal delivery after a single push. Delivery was performed by the labor and delivery unit Clinical Course     Assessment: Imminent delivery      Plan:  Labor and delivery            Jennye MoccasinBrian S Natha Guin, MD 09/11/16 1456

## 2016-09-11 NOTE — H&P (Signed)
OB History & Physical   History of Present Illness:  Chief Complaint:  Pt. Brought up from emergency room complete/comlete/+2 with urge to push.   HPI:  Gevena Martancy M Garcia is a 25 y.o. G1P0 female at 678w1d dated by 13 week 6 day ultrasound.  Her pregnancy has been complicated by positive chlamydia and marijuana use in early pregnancy..  She presents to L&D in second stage of labor.  Contractions started at about 10 am am, they started getting stronger around noon and she took a bath.  The contractions continued to increase in strength, so she had her mom drive her to the hospital.  She claims to have been pushing in the car.  Delivered shortly after presenting to unit.    Prenatal care site: [facility].  Plans to breastfeed, breastfed 518 year old son.  Tdap UTD 07/27/2016.  Plans to use depo for contraception.     Maternal Medical History:   Past Medical History:  Diagnosis Date  . Chlamydia infection affecting pregnancy 2017    Past Surgical History:  Procedure Laterality Date  . NO PAST SURGERIES      No Known Allergies  Prior to Admission medications   Medication Sig Start Date End Date Taking? Authorizing Provider  Prenatal Vit-Fe Fumarate-FA (MULTIVITAMIN-PRENATAL) 27-0.8 MG TABS tablet Take 1 tablet by mouth daily at 12 noon.   Yes Historical Provider, MD   Social History: She  reports that she has been smoking.  She has never used smokeless tobacco. She reports that she uses drugs, including Marijuana.  Family History: family history is not on file.   Review of Systems: Negative x 10 systems reviewed except as noted in the HPI.    Physical Exam:  Vitals: 118/65 post-partum General: no acute distress, post-partum HEENT: normocephalic, atraumatic  Heart: regular rate & rhythm.  No murmurs Lungs: clear to auscultation bilaterally Abdomen: Firm, NT, ML, U-2 Pelvic: Baby skin to skin with mom Extremities: non-tender, symmetric, trace edema bilaterally.   Neurologic: Alert & oriented x 3.    Pertinent Results:  Prenatal Labs: Blood type/Rh O positive  Antibody screen Negative  Rubella Varicella Immune Immune  RPR Non reactive  HBsAg Non reactive  HIV Negative  GC Negative  Chlamydia Positive 03/16/16, Negative TOC, most recent 08/31/16  Genetic screening N/A  1 hour GTT 69  3 hour GTT N/A  GBS Positive on 08/31/16    Assessment:  Gevena Martancy M Garcia is a 25 y.o. G1P0 female at 568w1d with rapid delivery on arrival to unit.  Now post-partum.     Plan:  1. Admit to Labor & Delivery. 2. CBC in am 3. GBS Positive, no time to treat.  Will inform pediatrics.   4. Consents obtained post-partum. 5. Regular diet 6. O+/ RI/ VI/ GBS Pos, Tdap UTD 7. Breastfeeding 8. Depo-provera    Cleotis Sparr  09/11/2016 3:12 PM

## 2016-09-12 LAB — CBC
HCT: 39.1 % (ref 35.0–47.0)
Hemoglobin: 13.4 g/dL (ref 12.0–16.0)
MCH: 30.8 pg (ref 26.0–34.0)
MCHC: 34.2 g/dL (ref 32.0–36.0)
MCV: 89.9 fL (ref 80.0–100.0)
PLATELETS: 213 10*3/uL (ref 150–440)
RBC: 4.35 MIL/uL (ref 3.80–5.20)
RDW: 14.3 % (ref 11.5–14.5)
WBC: 10.8 10*3/uL (ref 3.6–11.0)

## 2016-09-12 NOTE — Care Management (Signed)
RNCM consult received for information regarding Medicaid. Patient has Medicaid and should follow up with her case worker at [facility] with any questions. Most medications are covered and should provide a provider to follow up with after hospitalization. Please call with any other questions RNCM [phone].

## 2016-09-12 NOTE — Progress Notes (Signed)
Post Partum Day 1 Subjective: Doing well, no complaints.  Tolerating regular diet, pain with PO meds, voiding and ambulating without difficulty.  No CP SOB F/C N/V or leg pain No HA, change of vision, RUQ/epigastric pain  Objective: BP 101/60 (BP Location: Left Arm)   Pulse 60   Temp 98.2 F (36.8 C) (Oral)   Resp 18   Ht 5\' 3"  (1.6 m)   Wt 183 lb (83 kg)   LMP 03/25/2016   SpO2 98%   Breastfeeding   BMI 32.42 kg/m   Physical Exam:  General: NAD CV: RRR Pulm: nl effort, CTABL Lochia: moderate Uterine Fundus: fundus firm and below umbilicus DVT Evaluation: no cords, ttp LEs    Recent Labs  09/12/16 0550  HGB 13.4  HCT 39.1  WBC 10.8  PLT 213    Assessment/Plan: 25 y.o. G2P2002 postpartum day # 1  1. Continue routine postpartum care 2. O+, Rubella Immune, Varicella Immune 3. TDAP UTD 4. Breastfeeding/Depo 5. Disposition: Discharge to home tomorrow   Tresea MallGLEDHILL,Brandell Maready, CNM

## 2016-09-13 LAB — RPR: RPR Ser Ql: NONREACTIVE

## 2016-09-13 MED ORDER — IBUPROFEN 600 MG PO TABS: 600.0000 mg | ORAL_TABLET | Freq: Four times a day (QID) | ORAL | 0 refills | Status: DC | PRN

## 2016-09-13 NOTE — Discharge Instructions (Signed)
Please call your doctor or return to the ER if you experience any chest pains, shortness of breath, fever greater than 101, any heavy bleeding or large clots, and foul smelling vaginal discharge, any worsening abdominal pain & cramping that is not controlled by pain medication, or any signs of post partum depression.  No tampons, enemas, douches, or sexual intercourse for 6 weeks.  Also avoid tub baths, hot tubs, or swimming for 6 weeks.   Vaginal Delivery, Care After Refer to this sheet in the next few weeks. These discharge instructions provide you with information on caring for yourself after delivery. Your caregiver may also give you specific instructions. Your treatment has been planned according to the most current medical practices available, but problems sometimes occur. Call your caregiver if you have any problems or questions after you go home. HOME CARE INSTRUCTIONS 1. Take over-the-counter or prescription medicines only as directed by your caregiver or pharmacist. 2. Do not drink alcohol, especially if you are breastfeeding or taking medicine to relieve pain. 3. Do not smoke tobacco. 4. Continue to use good perineal care. Good perineal care includes: 1. Wiping your perineum from back to front 2. Keeping your perineum clean. 3. You can do sitz baths twice a day, to help keep this area clean 5. Do not use tampons, douche or have sex for 6 weeks 6. Shower only and avoid sitting in submerged water, aside from sitz baths 7. Wear a well-fitting bra that provides breast support. 8. Eat healthy foods. 9. Drink enough fluids to keep your urine clear or pale yellow. 10. Eat high-fiber foods such as whole grain cereals and breads, brown rice, beans, and fresh fruits and vegetables every day. These foods may help prevent or relieve constipation. 11. Avoid constipation with high fiber foods or medications, such as miralax or metamucil 12. Follow your caregiver's recommendations regarding  resumption of activities such as climbing stairs, driving, lifting, exercising, or traveling. 13. Talk to your caregiver about resuming sexual activities. Resumption of sexual activities after 6 weeks  is dependent upon your risk of infection, your rate of healing, and your comfort and desire to resume sexual activity. 14. Try to have someone help you with your household activities and your newborn for at least a few days after you leave the hospital. 15. Rest as much as possible. Try to rest or take a nap when your newborn is sleeping. 16. Increase your activities gradually. 17. Keep all of your scheduled postpartum appointments. It is very important to keep your scheduled follow-up appointments. At these appointments, your caregiver will be checking to make sure that you are healing physically and emotionally. SEEK MEDICAL CARE IF:   You are passing large clots from your vagina. Save any clots to show your caregiver.  You have a foul smelling discharge from your vagina.  You have trouble urinating.  You are urinating frequently.  You have pain when you urinate.  You have a change in your bowel movements.  You have increasing redness, pain, or swelling near your vaginal incision (episiotomy) or vaginal tear.  You have pus draining from your episiotomy or vaginal tear.  Your episiotomy or vaginal tear is separating.  You have painful, hard, or reddened breasts.  You have a severe headache.  You have blurred vision or see spots.  You feel sad or depressed.  You have thoughts of hurting yourself or your newborn.  You have questions about your care, the care of your newborn, or medicines.  You are  dizzy or light-headed.  You have a rash.  You have nausea or vomiting.  You were breastfeeding and have not had a menstrual period within 12 weeks after you stopped breastfeeding.  You are not breastfeeding and have not had a menstrual period by the 12th week after  delivery.  You have a fever of 100.5 or more. SEEK IMMEDIATE MEDICAL CARE IF:   You have persistent pain.  You have chest pain.  You have shortness of breath.  You faint.  You have leg pain.  You have stomach pain.  Your vaginal bleeding saturates two or more sanitary pads in 1 hour. MAKE SURE YOU:   Understand these instructions.  Will watch your condition.  Will get help right away if you are not doing well or get worse. Document Released: 11/18/2000 Document Revised: 04/07/2014 Document Reviewed: 07/18/2012 [facility] Patient Information 2015 [place], [place]. This information is not intended to replace advice given to you by your health care provider. Make sure you discuss any questions you have with your health care provider.  Sitz Bath A sitz bath is a warm water bath taken in the sitting position. The water covers only the hips and butt (buttocks). We recommend using one that fits in the toilet, to help with ease of use and cleanliness. It may be used for either healing or cleaning purposes. Sitz baths are also used to relieve pain, itching, or muscle tightening (spasms). The water may contain medicine. Moist heat will help you heal and relax.  HOME CARE  Take 3 to 4 sitz baths a day. 18. Fill the bathtub half-full with warm water. 19. Sit in the water and open the drain a little. 20. Turn on the warm water to keep the tub half-full. Keep the water running constantly. 21. Soak in the water for 15 to 20 minutes. 22. After the sitz bath, pat the affected area dry. GET HELP RIGHT AWAY IF: You get worse instead of better. Stop the sitz baths if you get worse. MAKE SURE YOU:  Understand these instructions.  Will watch your condition.  Will get help right away if you are not doing well or get worse. Document Released: 12/29/2004 Document Revised: 08/15/2012 Document Reviewed: 03/21/2011 [facility] Patient Information 2015 [place], [place]. This information is not intended to  replace advice given to you by your health care provider. Make sure you discuss any questions you have with your health care provider.

## 2016-09-13 NOTE — Progress Notes (Signed)
D/C order from MD.  Reviewed d/c instructions and prescriptions with patient and answered any questions.  Patient d/c home with infant via wheelchair by nursing/auxillary. 

## 2016-12-05 NOTE — L&D Delivery Note (Signed)
Date of delivery: 07/17/17 Estimated Date of Delivery: 07/23/17 LMP: 10/27/16 EGA: 39.1  At 9:10 AM a viable female was delivered via Vaginal, Spontaneous Delivery.  APGAR: pending; weight pending.   Placenta status: spontaneous, intact.  Cord: 3vessels with the following complications: none apparent.  Cord pH: not collected.  Anesthesia:  None Episiotomy: None Lacerations: None Suture Repair: n/a Est. Blood Loss (mL):  100cc  Mom presented to the ED with complaints of contractions and urge to push.  Dr. Don PerkingVeronese - ED Dept - palpated the head at +2 station.  She was brought up to L&D on a stretcher and proceeded to push with delivery of head + body through intact perineum.  No nuchal, no shoulder dystocia, No restitution.  Cord was doubly clamped and cut and handed to awaiting pediatric team.  Placenta was delivered intact, spontaneously, immediately afterward.  No lacerations.  IV pitocin given for hemorrhage prophylaxis.   Mom to postpartum.  Baby to Couplet care / Skin to Skin.  Cassandra Dixon 07/17/2017, 9:45 AM

## 2017-05-04 ENCOUNTER — Other Ambulatory Visit: Payer: Self-pay | Admitting: Physician Assistant

## 2017-05-04 DIAGNOSIS — Z3482 Encounter for supervision of other normal pregnancy, second trimester: Secondary | ICD-10-CM

## 2017-05-04 LAB — HM HIV SCREENING LAB: HM HIV Screening: NEGATIVE

## 2017-05-04 LAB — OB RESULTS CONSOLE HIV ANTIBODY (ROUTINE TESTING): HIV: NONREACTIVE

## 2017-05-05 LAB — OB RESULTS CONSOLE RPR: RPR: NONREACTIVE

## 2017-05-05 LAB — OB RESULTS CONSOLE GC/CHLAMYDIA
Chlamydia: NEGATIVE
GC PROBE AMP, GENITAL: NEGATIVE

## 2017-05-05 LAB — OB RESULTS CONSOLE HEPATITIS B SURFACE ANTIGEN: HEP B S AG: NEGATIVE

## 2017-05-05 LAB — OB RESULTS CONSOLE VARICELLA ZOSTER ANTIBODY, IGG: VARICELLA IGG: IMMUNE

## 2017-05-05 LAB — OB RESULTS CONSOLE RUBELLA ANTIBODY, IGM: RUBELLA: IMMUNE

## 2017-05-15 ENCOUNTER — Ambulatory Visit: Payer: Medicaid Other

## 2017-05-15 ENCOUNTER — Ambulatory Visit
Admission: RE | Admit: 2017-05-15 | Discharge: 2017-05-15 | Disposition: A | Discharge status: Home / Self Care | Payer: Medicaid Other | Source: Ambulatory Visit | Attending: Physician Assistant | Admitting: Physician Assistant

## 2017-05-15 DIAGNOSIS — Z3482 Encounter for supervision of other normal pregnancy, second trimester: Secondary | ICD-10-CM

## 2017-07-13 LAB — OB RESULTS CONSOLE GBS: GBS: POSITIVE

## 2017-07-13 LAB — OB RESULTS CONSOLE GC/CHLAMYDIA
CHLAMYDIA, DNA PROBE: NEGATIVE
Gonorrhea: NEGATIVE

## 2017-07-17 ENCOUNTER — Inpatient Hospital Stay
Admission: AD | Admit: 2017-07-17 | Discharge: 2017-07-19 | DRG: 775 | Disposition: A | Discharge status: Home / Self Care | Payer: Medicaid Other | Source: Ambulatory Visit | Attending: Obstetrics & Gynecology | Admitting: Obstetrics & Gynecology

## 2017-07-17 DIAGNOSIS — Z87891 Personal history of nicotine dependence: Secondary | ICD-10-CM

## 2017-07-17 DIAGNOSIS — Z3A39 39 weeks gestation of pregnancy: Secondary | ICD-10-CM | POA: Diagnosis not present

## 2017-07-17 DIAGNOSIS — Z3493 Encounter for supervision of normal pregnancy, unspecified, third trimester: Secondary | ICD-10-CM | POA: Diagnosis present

## 2017-07-17 DIAGNOSIS — O99324 Drug use complicating childbirth: Secondary | ICD-10-CM | POA: Diagnosis present

## 2017-07-17 DIAGNOSIS — F1290 Cannabis use, unspecified, uncomplicated: Secondary | ICD-10-CM | POA: Diagnosis present

## 2017-07-17 DIAGNOSIS — O99824 Streptococcus B carrier state complicating childbirth: Secondary | ICD-10-CM | POA: Diagnosis present

## 2017-07-17 LAB — CBC
HEMATOCRIT: 40.9 % (ref 35.0–47.0)
HEMOGLOBIN: 13.7 g/dL (ref 12.0–16.0)
MCH: 30 pg (ref 26.0–34.0)
MCHC: 33.5 g/dL (ref 32.0–36.0)
MCV: 89.3 fL (ref 80.0–100.0)
Platelets: 205 10*3/uL (ref 150–440)
RBC: 4.57 MIL/uL (ref 3.80–5.20)
RDW: 14.6 % — AB (ref 11.5–14.5)
WBC: 14.3 10*3/uL — ABNORMAL HIGH (ref 3.6–11.0)

## 2017-07-17 LAB — URINE DRUG SCREEN, QUALITATIVE (ARMC ONLY)
Amphetamines, Ur Screen: NOT DETECTED
Barbiturates, Ur Screen: NOT DETECTED
Benzodiazepine, Ur Scrn: NOT DETECTED
CANNABINOID 50 NG, UR ~~LOC~~: POSITIVE — AB
Cocaine Metabolite,Ur ~~LOC~~: NOT DETECTED
MDMA (ECSTASY) UR SCREEN: NOT DETECTED
Methadone Scn, Ur: NOT DETECTED
OPIATE, UR SCREEN: NOT DETECTED
PHENCYCLIDINE (PCP) UR S: NOT DETECTED
Tricyclic, Ur Screen: NOT DETECTED

## 2017-07-17 LAB — TYPE AND SCREEN
ABO/RH(D): O POS
Antibody Screen: NEGATIVE

## 2017-07-17 MED ORDER — OXYTOCIN 40 UNITS IN LACTATED RINGERS INFUSION - SIMPLE MED: 2.5000 [IU]/h | INTRAVENOUS | Status: DC

## 2017-07-17 MED ORDER — ONDANSETRON HCL 4 MG PO TABS: 4.0000 mg | ORAL_TABLET | ORAL | Status: DC | PRN

## 2017-07-17 MED ORDER — COCONUT OIL OIL: 1.0000 "application " | TOPICAL_OIL | Status: DC | PRN

## 2017-07-17 MED ORDER — OXYTOCIN BOLUS FROM INFUSION
500.0000 mL | Freq: Once | INTRAVENOUS | Status: AC
  Administered 2017-07-17: 500 mL via INTRAVENOUS

## 2017-07-17 MED ORDER — IBUPROFEN 600 MG PO TABS
600.0000 mg | ORAL_TABLET | Freq: Four times a day (QID) | ORAL | Status: DC
  Administered 2017-07-17 – 2017-07-18 (×3): 600 mg via ORAL
  Filled 2017-07-17 (×4): qty 1

## 2017-07-17 MED ORDER — PRENATAL MULTIVITAMIN CH
1.0000 | ORAL_TABLET | Freq: Every day | ORAL | Status: DC
  Administered 2017-07-17 – 2017-07-18 (×2): 1 via ORAL
  Filled 2017-07-17 (×2): qty 1

## 2017-07-17 MED ORDER — DIBUCAINE 1 % RE OINT
1.0000 "application " | TOPICAL_OINTMENT | RECTAL | Status: DC | PRN
  Filled 2017-07-17: qty 28

## 2017-07-17 MED ORDER — BUTORPHANOL TARTRATE 1 MG/ML IJ SOLN: 1.0000 mg | INTRAMUSCULAR | Status: DC | PRN

## 2017-07-17 MED ORDER — SIMETHICONE 80 MG PO CHEW: 80.0000 mg | CHEWABLE_TABLET | ORAL | Status: DC | PRN

## 2017-07-17 MED ORDER — DIPHENHYDRAMINE HCL 25 MG PO CAPS: 25.0000 mg | ORAL_CAPSULE | Freq: Four times a day (QID) | ORAL | Status: DC | PRN

## 2017-07-17 MED ORDER — LIDOCAINE HCL (PF) 1 % IJ SOLN: 30.0000 mL | INTRAMUSCULAR | Status: DC | PRN

## 2017-07-17 MED ORDER — LACTATED RINGERS IV SOLN: 500.0000 mL | INTRAVENOUS | Status: DC | PRN

## 2017-07-17 MED ORDER — ACETAMINOPHEN 500 MG PO TABS: 1000.0000 mg | ORAL_TABLET | Freq: Four times a day (QID) | ORAL | Status: DC | PRN

## 2017-07-17 MED ORDER — ONDANSETRON HCL 4 MG/2ML IJ SOLN: 4.0000 mg | INTRAMUSCULAR | Status: DC | PRN

## 2017-07-17 MED ORDER — WITCH HAZEL-GLYCERIN EX PADS
1.0000 "application " | MEDICATED_PAD | CUTANEOUS | Status: DC
  Administered 2017-07-17: 1 via TOPICAL
  Filled 2017-07-17: qty 100

## 2017-07-17 MED ORDER — BENZOCAINE-MENTHOL 20-0.5 % EX AERO
1.0000 "application " | INHALATION_SPRAY | CUTANEOUS | Status: DC | PRN
  Administered 2017-07-17: 1 via TOPICAL
  Filled 2017-07-17: qty 56

## 2017-07-17 MED ORDER — ONDANSETRON HCL 4 MG/2ML IJ SOLN: 4.0000 mg | Freq: Four times a day (QID) | INTRAMUSCULAR | Status: DC | PRN

## 2017-07-17 MED ORDER — SOD CITRATE-CITRIC ACID 500-334 MG/5ML PO SOLN: 30.0000 mL | ORAL | Status: DC | PRN

## 2017-07-17 MED ORDER — DOCUSATE SODIUM 100 MG PO CAPS
100.0000 mg | ORAL_CAPSULE | Freq: Two times a day (BID) | ORAL | Status: DC
  Administered 2017-07-17 – 2017-07-18 (×3): 100 mg via ORAL
  Filled 2017-07-17 (×3): qty 1

## 2017-07-17 MED ORDER — IBUPROFEN 600 MG PO TABS
ORAL_TABLET | ORAL | Status: AC
  Administered 2017-07-17: 600 mg via ORAL
  Filled 2017-07-17: qty 1

## 2017-07-17 MED ORDER — LACTATED RINGERS IV SOLN: INTRAVENOUS | Status: DC

## 2017-07-17 NOTE — Consult Note (Addendum)
Delivery Note    Requested by Dr.  Don PerkingVeronese to attend this vaginal delivery at [redacted] weeks GA due to presentation to the ED in labor.   Born to a G3P2 mother with pregnancy complicated by:  1. Close interval pregnancy 2. History of precipitous delivery x2 3. Late entry to care 4. GBS positive 5. Marijuana use AROM occurred about 2 hours prior to delivery with clear fluid.  Infant vigorous with good spontaneous cry.  Routine NRP followed including warming, drying and stimulation.  Apgars 9 / 9.  Physical exam within normal limits.   Left in L and D for skin-to-skin contact with mother, in care of transition nursing staff.  Care transferred to Pediatrician.  John GiovanniBenjamin Lewi Drost, DO  Neonatologist

## 2017-07-17 NOTE — Discharge Summary (Signed)
Obstetrical Discharge Summary  Patient Name: Cassandra Dixon DOB: 20-Aug-1991 MRN: [id number]  Date of Admission: 07/17/2017 Date of Delivery: 07/17/17 Delivered by: Ranae Plumberhelsea Ward, with Dr. Don PerkingVeronese Date of Discharge: 07/19/17 Primary OB: [facility] BJY:[id number]:Patient's last menstrual period was 10/27/2016 (approximate). EDC Estimated Date of Delivery: 07/23/17 Gestational Age at Delivery: 4763w1d   Antepartum complications:  Pregnancy Issues: 1. Close interval pregnancy 2. History of precipitous delivery x2 3. Late entry to care 4. GBS positive 5. Marijuana user  Admitting Diagnosis: labor Secondary Diagnosis: Patient Active Problem List   Diagnosis Date Noted  . Precipitous delivery 07/17/2017  . Labor and delivery, indication for care 07/17/2017  . Labor and delivery indication for care or intervention 07/17/2017    Augmentation: none Complications: None Intrapartum complications/course: Mom presented to the ED with complaints of contractions and urge to push.  Dr. Don PerkingVeronese - ED Dept - palpated the head at +2 station.  She was brought up to L&D on a stretcher and proceeded to push with delivery of head + body through intact perineum.  No nuchal, no shoulder dystocia, No restitution.  Cord was doubly clamped and cut and handed to awaiting pediatric team.  Placenta was delivered intact, spontaneously, immediately afterward.  No lacerations.  IV pitocin given for hemorrhage prophylaxis. Date of Delivery: 07/17/17 Delivered By: Leeroy Bockhelsea Ward and Dr. Don PerkingVeronese Delivery Type: spontaneous vaginal delivery, precipitous labor  Anesthesia: none Placenta: sponatneous Laceration: none Episiotomy: none Newborn Data: Live born female,  Birth Weight: APGAR: , 9/9   Postpartum Procedures: none  Post partum course: uncomplicated  Patient had an uncomplicated postpartum course.  By time of discharge on PPD# 2, her pain was controlled on oral pain medications; she had appropriate lochia and was ambulating,  voiding without difficulty and tolerating regular diet.  She was deemed stable for discharge to home.    Discharge Physical Exam: 07/19/17 BP (!) 101/59 (BP Location: Right Arm)   Pulse (!) 53   Temp 98.2 F (36.8 C) (Oral)   Resp 17   Ht 5\' 2"  (1.575 m)   Wt 79.4 kg (175 lb)   LMP 10/27/2016 (Approximate)   SpO2 100%   Breastfeeding? Unknown   BMI 32.01 kg/m   General: NAD CV: RRR Pulm: CTABL, nl effort ABD: s/nd/nt, fundus firm and below the umbilicus Lochia: moderate DVT Evaluation: LE non-ttp, no evidence of DVT on exam.  Hemoglobin  Date Value Ref Range Status  07/18/2017 12.1 12.0 - 16.0 g/dL Final   HGB  Date Value Ref Range Status  02/27/2014 12.7 12.0 - 16.0 g/dL Final   HCT  Date Value Ref Range Status  07/18/2017 35.3 35.0 - 47.0 % Final  02/28/2014 35.8 35.0 - 47.0 % Final     Disposition: stable, discharge to home. Baby Feeding: breastmilkformula Baby Disposition: home with mom  Rh Immune globulin given: n/a Rubella vaccine given: n/a Tdap vaccine given in AP or PP setting: a/p Flu vaccine given in AP or PP setting: n/a  Contraception: nexplanon  Prenatal Labs:   Blood type/Rh O+  Antibody screen neg  Rubella Immune  Varicella Immune  RPR NR  HBsAg Neg  HIV NR  GC neg  Chlamydia neg  Genetic screening negative  1 hour GTT 70  3 hour GTT   GBS positive    Plan:  Cassandra Dixon was discharged to home in good condition. Follow-up appointment with delivering provider in 6 weeks.  Discharge Medications: Allergies as of 07/19/2017      Reactions  Shrimp [shellfish Allergy] Shortness Of Breath, Swelling      Medication List    TAKE these medications   ibuprofen 600 MG tablet Commonly known as:  ADVIL,MOTRIN Take 1 tablet (600 mg total) by mouth every 6 (six) hours. What changed:  when to take this  reasons to take this   multivitamin-prenatal 27-0.8 MG Tabs tablet Take 1 tablet by mouth daily at 12 noon.        Follow-up Information    Department, [facility] Follow up in 6 week(s).   Contact information: [address] [place] [address] [phone]           Signed: Ihor Austin Addy Mcmannis MD Hit refresh and delete this line

## 2017-07-17 NOTE — ED Provider Notes (Signed)
[facility] Emergency Department Provider Note  ____________________________________________  Time seen: Approximately 3:45 PM  I have reviewed the triage vital signs and the nursing notes.   HISTORY  Chief Complaint No chief complaint on file.   HPI Cassandra Dixon is a 26 y.o. female G3P2 at [redacted] weeks GA who presents for evaluation of active labor and contractions. Patient reports severe intermittent contractions every 5 min since 6 AM this morning with positive water loss.Patient arrives in the ED in active labor.   Past Medical History:  Diagnosis Date  . Chlamydia infection affecting pregnancy 2017    Patient Active Problem List   Diagnosis Date Noted  . Precipitous delivery 07/17/2017  . Labor and delivery, indication for care 07/17/2017  . Labor and delivery indication for care or intervention 07/17/2017    Past Surgical History:  Procedure Laterality Date  . NO PAST SURGERIES      Prior to Admission medications   Medication Sig Start Date End Date Taking? Authorizing Provider  Prenatal Vit-Fe Fumarate-FA (MULTIVITAMIN-PRENATAL) 27-0.8 MG TABS tablet Take 1 tablet by mouth daily at 12 noon.   Yes [provider]  ibuprofen (ADVIL,MOTRIN) 600 MG tablet Take 1 tablet (600 mg total) by mouth every 6 (six) hours as needed. 09/13/16   Marta Antu, CNM    Allergies Shrimp [shellfish allergy]  History reviewed. No pertinent family history.  Social History Social History  Substance Use Topics  . Smoking status: Former Games developer  . Smokeless tobacco: Never Used  . Alcohol use No    Review of Systems  Constitutional: Negative for fever. Eyes: Negative for visual changes. ENT: Negative for sore throat. Neck: No neck pain  Cardiovascular: Negative for chest pain. Respiratory: Negative for shortness of breath. Gastrointestinal: + contraction Genitourinary: Negative for dysuria. Musculoskeletal: Negative for back  pain. Skin: Negative for rash. Neurological: Negative for headaches, weakness or numbness. Psych: No SI or HI  ____________________________________________   PHYSICAL EXAM:  VITAL SIGNS: ED Triage Vitals  Enc Vitals Group     BP 09/11/16 1450 118/65     Pulse Rate 09/11/16 1450 69     Resp 09/11/16 1450 18     Temp 09/11/16 1514 99 F (37.2 C)     Temp Source 09/11/16 1514 Oral     SpO2 09/11/16 1738 100 %     Weight 09/11/16 1608 183 lb (83 kg)     Height 09/11/16 1608 5\' 3"  (1.6 m)     Head Circumference --      Peak Flow --      Pain Score 09/11/16 1635 0     Pain Loc --      Pain Edu? --      Excl. in GC? --     Constitutional: Alert and oriented, in active labor.  HEENT:      Head: Normocephalic and atraumatic.         Eyes: Conjunctivae are normal. Sclera is non-icteric.       Mouth/Throat: Mucous membranes are moist.       Neck: Supple with no signs of meningismus. Cardiovascular: Regular rate and rhythm.  Respiratory: Normal respiratory effort. Lungs are clear to auscultation bilaterally.  Gastrointestinal: Gravid, fetal head at +2 with active contractions Genitourinary: No CVA tenderness. Musculoskeletal: No edema, cyanosis, or erythema of extremities. Neurologic: Normal speech and language. Face is symmetric. Moving all extremities. No gross focal neurologic deficits are appreciated. Skin: Skin is warm, dry and intact. No rash noted.  Psychiatric: Mood and affect are normal. Speech and behavior are normal.  ____________________________________________   LABS (all labs ordered are listed, but only abnormal results are displayed)  Labs Reviewed  CHLAMYDIA/NGC RT PCR ([facility] ONLY)  URINE DRUG SCREEN, QUALITATIVE ([facility] ONLY)  CBC  RPR   ____________________________________________  EKG  NONE  ____________________________________________  RADIOLOGY  NONE   ____________________________________________   PROCEDURES  Procedure(s)  performed:yes Procedures   Patient brought to labor and delivery via stretcher  Vaginal exam palpated head +2 station  Patient started to push and crowning was noted. Since no OBGYN MD was in the room I proceeded to deliver the baby. Patient delivered a female infant through intact perineum No nuchal cord. No shoulder dystocia. Cord was doubly clamped and cut. Infant was handed to pediatric team Placenta delivered intact, spontaneously, immediately afterwards Perineum inspected with no lacerations Care transferred to Dr. Elesa MassedWard OB/GYN on call with patient and baby in stable good condition.   Critical Care performed:  Yes  CRITICAL CARE Performed by: Nita Sicklearolina Tiburcio Linder  ?  Total critical care time: 35 min  Critical care time was exclusive of separately billable procedures and treating other patients.  Critical care was necessary to treat or prevent imminent or life-threatening deterioration.  Critical care was time spent personally by me on the following activities: development of treatment plan with patient and/or surrogate as well as nursing, discussions with consultants, evaluation of patient's response to treatment, examination of patient, obtaining history from patient or surrogate, ordering and performing treatments and interventions, ordering and review of laboratory studies, ordering and review of radiographic studies, pulse oximetry and re-evaluation of patient's condition.  ____________________________________________   INITIAL IMPRESSION / ASSESSMENT AND PLAN / ED COURSE  26 y.o. female G3P2 at 2238 weeks GA who presents for evaluation of active labor and contractions. Patient found to be in active labor in the emergency department. Vaginal exam showing had a +2 station. Code stork. The patient was taken to labor and delivery. Upon arrival to labor and delivery, fetus was noted to be crowning. No OB MD in the room and delivery was performed by me per procedure note above. Child  delivered with no complications. Placenta delivered immediately after with no complications. Baby was given to NICU team in the room. Patient was signed out to Dr. Elesa MassedWard, Obgyn after delivery.      Pertinent labs & imaging results that were available during my care of the patient were reviewed by me and considered in my medical decision making (see chart for details).    ____________________________________________   FINAL CLINICAL IMPRESSION(S) / ED DIAGNOSES  Final diagnoses:  Normal labor      NEW MEDICATIONS STARTED DURING THIS VISIT:  Discharge Medication List as of 09/13/2016  9:21 AM    START taking these medications   Details  ibuprofen (ADVIL,MOTRIN) 600 MG tablet Take 1 tablet (600 mg total) by mouth every 6 (six) hours as needed., Starting Tue 09/13/2016, Print         Note:  This document was prepared using Dragon voice recognition software and may include unintentional dictation errors.    Don PerkingVeronese, [place], MD 07/17/17 [phone]

## 2017-07-17 NOTE — H&P (Signed)
OB History & Physical   Note created after precipitous delivery  History of Present Illness:  Chief Complaint:   HPI:  Gevena Martancy M Dixon is a 26 y.o. 73P2002 female at 5651w1d dated by 30 week ultrasound with unsure LMP and late presentation to care.  She presents to L&D with the urge to push and history of precipitous delivery x2.  Patient's last menstrual period was 10/27/2016 (approximate). Estimated Date of Delivery: 07/23/17 EGA: 39.1  +FM, +CTX, +LOF, no VB  Pregnancy Issues: 1. Close interval pregnancy 2. History of precipitous delivery x2 3. Late entry to care 4. GBS positive 5. Marijuana user  Maternal Medical History:   Past Medical History:  Diagnosis Date  . Chlamydia infection affecting pregnancy 2017    Past Surgical History:  Procedure Laterality Date  . NO PAST SURGERIES      Allergies  Allergen Reactions  . Shrimp [Shellfish Allergy] Shortness Of Breath and Swelling    Prior to Admission medications   Medication Sig Start Date End Date Taking? Authorizing Provider  ibuprofen (ADVIL,MOTRIN) 600 MG tablet Take 1 tablet (600 mg total) by mouth every 6 (six) hours as needed. 09/13/16   Marta AntuBrothers, Tamara, CNM  Prenatal Vit-Fe Fumarate-FA (MULTIVITAMIN-PRENATAL) 27-0.8 MG TABS tablet Take 1 tablet by mouth daily at 12 noon.    [provider]     [facility]   Social History: She  reports that she has quit smoking. She has never used smokeless tobacco. She reports that she uses drugs, including Marijuana. She reports that she does not drink alcohol.  Family History: no gyn cancers  Review of Systems: A full review of systems was performed and negative except as noted in the HPI.     Physical Exam:  Vital Signs: BP 129/75 (BP Location: Left Arm)   Pulse 60   Temp 98 F (36.7 C) (Oral)   Resp 16   Ht 5\' 2"  (1.575 m)   Wt 79.4 kg (175 lb)   LMP 10/27/2016 (Approximate)   BMI 32.01 kg/m  General: no acute  distress.  HEENT: normocephalic, atraumatic Heart: regular rate & rhythm.  No murmurs/rubs/gallops Lungs: clear to auscultation bilaterally, normal respiratory effort Abdomen: soft, gravid, non-tender;  EFW: 7lbs Pelvic:   External: Normal external female genitalia  Cervix: 10/100/ +3   Extremities: non-tender, symmetric, 1+ edema bilaterally.  DTRs: 2+ Neurologic: Alert & oriented x 3.     Pertinent Results:  Prenatal Labs: Blood type/Rh O+  Antibody screen neg  Rubella Immune  Varicella Immune  RPR NR  HBsAg Neg  HIV NR  GC neg  Chlamydia neg  Genetic screening negative  1 hour GTT 70  3 hour GTT   GBS positive     Cephalic by leopolds   Assessment:  Gevena Martancy M Dixon is a [age] y.o. 543P2002 female at 4051w1d with labor.   Plan:  1. Admit to Labor & Delivery 2. CBC, T&S, IVF 3. GBS positive - baby delivered prior to ability to order antibiotics.   4. Verbal then written consents obtained  ----- Ranae Plumberhelsea Ward, MD Attending Obstetrician and Gynecologist [facility], Department of OB/GYN [facility]

## 2017-07-18 LAB — CBC
HEMATOCRIT: 35.3 % (ref 35.0–47.0)
Hemoglobin: 12.1 g/dL (ref 12.0–16.0)
MCH: 30.7 pg (ref 26.0–34.0)
MCHC: 34.2 g/dL (ref 32.0–36.0)
MCV: 89.9 fL (ref 80.0–100.0)
PLATELETS: 176 10*3/uL (ref 150–440)
RBC: 3.92 MIL/uL (ref 3.80–5.20)
RDW: 15.3 % — AB (ref 11.5–14.5)
WBC: 7.9 10*3/uL (ref 3.6–11.0)

## 2017-07-18 LAB — RPR: RPR Ser Ql: NONREACTIVE

## 2017-07-18 MED ORDER — IBUPROFEN 600 MG PO TABS
600.0000 mg | ORAL_TABLET | Freq: Four times a day (QID) | ORAL | Status: DC
  Administered 2017-07-17 – 2017-07-18 (×3): 600 mg via ORAL
  Filled 2017-07-18 (×3): qty 1

## 2017-07-18 NOTE — Progress Notes (Signed)
Post Partum Day 1 Subjective: no complaints  Objective: Blood pressure 112/78, pulse 62, temperature 98 F (36.7 C), temperature source Oral, resp. rate 18, height 5\' 2"  (1.575 m), weight 79.4 kg (175 lb), last menstrual period 10/27/2016, SpO2 100 %, unknown if currently breastfeeding.  Physical Exam:  General: alert Lochia: appropriate Uterine Fundus: firm Incision: n/a DVT Evaluation: No evidence of DVT seen on physical exam.   Recent Labs  07/17/17 0925 07/18/17 0436  HGB 13.7 12.1  HCT 40.9 35.3    Assessment/Plan: Plan for discharge tomorrow   LOS: 1 day   Ihor Austinhomas J Schermerhorn 07/18/2017, 9:21 AM

## 2017-07-18 NOTE — ED Provider Notes (Signed)
[facility] Emergency Department Provider Note  ____________________________________________  Time seen: Approximately 11:38 AM  I have reviewed the triage vital signs and the nursing notes.   HISTORY  Chief Complaint No chief complaint on file.   HPI Cassandra Dixon is a 26 y.o. female G3P2 at 3338 weeks GA who presents for evaluation of active labor and contractions. Patient reports severe intermittent contractions every 5 min since 6 AM this morning with positive water loss.Patient arrives in the ED in active labor.   Past Medical History:  Diagnosis Date  . Chlamydia infection affecting pregnancy 2017    Patient Active Problem List   Diagnosis Date Noted  . Precipitous delivery 07/17/2017  . Labor and delivery, indication for care 07/17/2017  . Labor and delivery indication for care or intervention 07/17/2017    Past Surgical History:  Procedure Laterality Date  . NO PAST SURGERIES      Prior to Admission medications   Medication Sig Start Date End Date Taking? Authorizing Provider  Prenatal Vit-Fe Fumarate-FA (MULTIVITAMIN-PRENATAL) 27-0.8 MG TABS tablet Take 1 tablet by mouth daily at 12 noon.   Yes [provider]  ibuprofen (ADVIL,MOTRIN) 600 MG tablet Take 1 tablet (600 mg total) by mouth every 6 (six) hours as needed. 09/13/16   Marta AntuBrothers, Tamara, CNM    Allergies Shrimp [shellfish allergy]  History reviewed. No pertinent family history.  Social History Social History  Substance Use Topics  . Smoking status: Former Games developermoker  . Smokeless tobacco: Never Used  . Alcohol use No    Review of Systems Constitutional: Negative for fever. Eyes: Negative for visual changes. ENT: Negative for sore throat. Neck: No neck pain  Cardiovascular: Negative for chest pain. Respiratory: Negative for shortness of breath. Gastrointestinal: + contraction Genitourinary: Negative for dysuria. Musculoskeletal: Negative for back pain. Skin:  Negative for rash. Neurological: Negative for headaches, weakness or numbness. Psych: No SI or HI ____________________________________________   PHYSICAL EXAM:  VITAL SIGNS: ED Triage Vitals  Enc Vitals Group     BP 07/17/17 0922 129/75     Pulse Rate 07/17/17 0922 60     Resp 07/17/17 0922 16     Temp 07/17/17 0922 98 F (36.7 C)     Temp Source 07/17/17 0922 Oral     SpO2 07/17/17 1300 100 %     Weight 07/17/17 0922 175 lb (79.4 kg)     Height 07/17/17 0922 5\' 2"  (1.575 m)     Head Circumference --      Peak Flow --      Pain Score 07/17/17 0924 4     Pain Loc --      Pain Edu? --      Excl. in GC? --     Constitutional: Alert and oriented, in active labor.  HEENT:      Head: Normocephalic and atraumatic.         Eyes: Conjunctivae are normal. Sclera is non-icteric.       Mouth/Throat: Mucous membranes are moist.       Neck: Supple with no signs of meningismus. Cardiovascular: Regular rate and rhythm.  Respiratory: Normal respiratory effort. Lungs are clear to auscultation bilaterally.  Gastrointestinal: Gravid, fetal head at +2 with active contractions Genitourinary: No CVA tenderness. Musculoskeletal: No edema, cyanosis, or erythema of extremities. Neurologic: Normal speech and language. Face is symmetric. Moving all extremities. No gross focal neurologic deficits are appreciated. Skin: Skin is warm, dry and intact. No rash noted. Psychiatric: Mood  and affect are normal. Speech and behavior are normal.  ____________________________________________   LABS (all labs ordered are listed, but only abnormal results are displayed)  Labs Reviewed  CBC - Abnormal; Notable for the following:       Result Value   WBC 14.3 (*)    RDW 14.6 (*)    All other components within normal limits  URINE DRUG SCREEN, QUALITATIVE ([facility] ONLY) - Abnormal; Notable for the following:    Cannabinoid 50 Ng, Ur [place] POSITIVE (*)    All other components within normal limits  CBC -  Abnormal; Notable for the following:    RDW 15.3 (*)    All other components within normal limits  RPR  TYPE AND SCREEN   ____________________________________________  EKG  none  ____________________________________________  RADIOLOGY  none  ____________________________________________   PROCEDURES  Procedure(s) performed:yes Procedures   VAGINAL DELIVERY Patient brought to labor and delivery via stretcher  Vaginal exam palpated head +2 station  Patient started to push and crowning was noted. Since no OBGYN MD was in the room I proceeded to deliver the baby. Patient delivered a female infant through intact perineum No nuchal cord. No shoulder dystocia. Cord was doubly clamped and cut. Infant was handed to pediatric team Placenta delivered intact, spontaneously, immediately afterwards Perineum inspected with no lacerations Care transferred to Dr. Elesa Massed OB/GYN on call with patient and baby in stable good condition Critical Care performed:  Yes  CRITICAL CARE Performed by: Nita Sickle  ?  Total critical care time: 35 min  Critical care time was exclusive of separately billable procedures and treating other patients.  Critical care was necessary to treat or prevent imminent or life-threatening deterioration.  Critical care was time spent personally by me on the following activities: development of treatment plan with patient and/or surrogate as well as nursing, discussions with consultants, evaluation of patient's response to treatment, examination of patient, obtaining history from patient or surrogate, ordering and performing treatments and interventions, ordering and review of laboratory studies, ordering and review of radiographic studies, pulse oximetry and re-evaluation of patient's condition.  ____________________________________________   INITIAL IMPRESSION / ASSESSMENT AND PLAN / ED COURSE  26 y.o. female G3P2 at [redacted] weeks GA who presents for evaluation  of active labor and contractions. Patient found to be in active labor in the emergency department. Vaginal exam showing had a +2 station. Code stork. The patient was taken to labor and delivery. Upon arrival to labor and delivery, fetus was noted to be crowning. No OB MD in the room and delivery was performed by me per procedure note above. Child delivered with no complications. Placenta delivered immediately after with no complications. Baby was given to NICU team in the room. Patient was signed out to Dr. Elesa Massed, Obgyn after delivery.       Pertinent labs & imaging results that were available during my care of the patient were reviewed by me and considered in my medical decision making (see chart for details).    ____________________________________________   FINAL CLINICAL IMPRESSION(S) / ED DIAGNOSES  Vaginal Delivery   NEW MEDICATIONS STARTED DURING THIS VISIT:  Current Discharge Medication List       Note:  This document was prepared using Dragon voice recognition software and may include unintentional dictation errors.    Don Perking, [place], MD 07/18/17 1140

## 2017-07-19 MED ORDER — IBUPROFEN 600 MG PO TABS: 600.0000 mg | ORAL_TABLET | Freq: Four times a day (QID) | ORAL | 0 refills | Status: DC

## 2017-07-19 NOTE — Progress Notes (Signed)
Discharge to home; to car via auxillary 

## 2019-09-26 ENCOUNTER — Ambulatory Visit: Payer: Self-pay

## 2019-12-10 ENCOUNTER — Ambulatory Visit: Payer: Self-pay

## 2019-12-13 ENCOUNTER — Other Ambulatory Visit: Payer: Self-pay

## 2019-12-13 ENCOUNTER — Ambulatory Visit: Payer: Self-pay | Admitting: Physician Assistant

## 2019-12-13 ENCOUNTER — Encounter: Payer: Self-pay | Admitting: Physician Assistant

## 2019-12-13 DIAGNOSIS — T192XXA Foreign body in vulva and vagina, initial encounter: Secondary | ICD-10-CM

## 2019-12-13 DIAGNOSIS — B9689 Other specified bacterial agents as the cause of diseases classified elsewhere: Secondary | ICD-10-CM

## 2019-12-13 DIAGNOSIS — N760 Acute vaginitis: Secondary | ICD-10-CM

## 2019-12-13 DIAGNOSIS — Z113 Encounter for screening for infections with a predominantly sexual mode of transmission: Secondary | ICD-10-CM

## 2019-12-13 LAB — WET PREP FOR TRICH, YEAST, CLUE
Trichomonas Exam: NEGATIVE
Yeast Exam: NEGATIVE

## 2019-12-13 MED ORDER — METRONIDAZOLE 500 MG PO TABS: 500.0000 mg | ORAL_TABLET | Freq: Two times a day (BID) | ORAL | 0 refills | Status: AC

## 2019-12-13 NOTE — Progress Notes (Signed)
[facility] Department STI clinic/screening visit  Subjective:  Cassandra Dixon is a 29 y.o. female being seen today for an STI screening visit. The patient reports they do have symptoms.  Patient reports that they do not desire a pregnancy in the next year.   They reported they are not interested in discussing contraception today.  No LMP recorded.   Patient has the following medical conditions:   Patient Active Problem List   Diagnosis Date Noted  . Precipitous delivery 07/17/2017  . Labor and delivery, indication for care 07/17/2017  . Labor and delivery indication for care or intervention 07/17/2017    Chief Complaint  Patient presents with  . SEXUALLY TRANSMITTED DISEASE    HPI  Patient reports that she has been having some irregular bleeding for 2 weeks.  States that she does not know if it is due to her birth control or if something else is going on.  Reports that she has noticed an increase in vaginal discharge and feeling of "wetness" but no itching, burning or odor. States that her Nexplanon is due for removal later this year.  Last pap 2018.  Same partner for years but is not sure if he has other partners.  See flowsheet for further details and programmatic requirements.    The following portions of the patient's history were reviewed and updated as appropriate: allergies, current medications, past medical history, past social history, past surgical history and problem list.  Objective:  There were no vitals filed for this visit.  Physical Exam Constitutional:      General: She is not in acute distress.    Appearance: Normal appearance. She is normal weight.  HENT:     Head: Normocephalic and atraumatic.     Comments: No nits, lice, or hair loss. No cervical, supraclavicular or axillary adenopathy.    Mouth/Throat:     Mouth: Mucous membranes are moist.     Pharynx: Oropharynx is clear. No oropharyngeal exudate or posterior oropharyngeal erythema.   Eyes:     Conjunctiva/sclera: Conjunctivae normal.  Pulmonary:     Effort: Pulmonary effort is normal.  Abdominal:     Palpations: Abdomen is soft. There is no mass.     Tenderness: There is no abdominal tenderness. There is no guarding or rebound.  Genitourinary:    Rectum: Normal.     Comments: External genitalia/pubic area without nits, lice, edema, erythema, lesions and inguinal adenopathy. Vagina with retained tampon that was removed without problems, mucosa normal, small amount of brownish discharge. Cervix without visible lesions. Uterus firm, mobile, nt, no masses, no CMT, no adnexal tenderness or fullness. Musculoskeletal:     Cervical back: Neck supple. No tenderness.  Skin:    General: Skin is warm and dry.     Findings: No bruising, erythema, lesion or rash.  Neurological:     Mental Status: She is alert and oriented to person, place, and time.  Psychiatric:        Mood and Affect: Mood normal.        Behavior: Behavior normal.        Thought Content: Thought content normal.        Judgment: Judgment normal.      Assessment and Plan:  Cassandra Dixon is a 29 y.o. female presenting [facility] for STI screening  1. Screening for STD (sexually transmitted disease) Patient into clinic with symptoms. Rec condoms with all sex. Await test results.  Counseled that RN  will call if needs to RTC for further treatment once results are back. - WET PREP FOR [place], YEAST, CLUE - Chlamydia/Gonorrhea [facility] - HIV [place] [facility] - Syphilis Serology, [facility]  2. BV (bacterial vaginosis) Will treat with Metronidazole 500mg  #14 1 po BID for 7 days, with food, no EtOH for 24 hr before and until 72 hr after completing medicine. No sex for 7 days. Enc to use OTC antifungal cream if has itching during or just after taking antibiotic. - metroNIDAZOLE (FLAGYL) 500 MG tablet; Take 1 tablet (500 mg total) by mouth 2 (two) times daily for 7 days.   Dispense: 14 tablet; Refill: 0  3. Foreign body in vagina, initial encounter Removed retained tampon from vagina. Counseled patient re:  Risks of Toxic Shock Syndrome and that she should be on the look out for rash, abd pain and fever.  Rec that if she experiences any of the above symptoms she should go to PCP or ER and explain re:  Retained tampon. Rec not using tampons for at least 3 months.     No follow-ups on file.  No future appointments.  Jerene Dilling, PA

## 2019-12-24 ENCOUNTER — Telehealth: Payer: Self-pay

## 2019-12-24 ENCOUNTER — Other Ambulatory Visit: Payer: Self-pay

## 2019-12-24 ENCOUNTER — Ambulatory Visit: Payer: Self-pay

## 2019-12-24 DIAGNOSIS — A549 Gonococcal infection, unspecified: Secondary | ICD-10-CM

## 2019-12-24 MED ORDER — CEFTRIAXONE SODIUM 250 MG IJ SOLR
250.0000 mg | Freq: Once | INTRAMUSCULAR | Status: AC
  Administered 2019-12-24: 21:00:00 250 mg via INTRAMUSCULAR

## 2019-12-24 MED ORDER — AZITHROMYCIN 500 MG PO TABS
1000.0000 mg | ORAL_TABLET | Freq: Once | ORAL | Status: AC
  Administered 2019-12-25: 12:00:00 1000 mg via ORAL

## 2019-12-24 NOTE — Telephone Encounter (Signed)
TC to patient. Verified ID via password/SS#. Informed of positive GC and chlamydia, and need for tx. Instructed to eat before visit and have partner call for tx appt. Appt scheduled. Richmond Campbell, RN

## 2019-12-25 NOTE — Progress Notes (Signed)
Patient tx'd for GC per SO. Tolerated well Richmond Campbell, RN

## 2020-01-13 ENCOUNTER — Other Ambulatory Visit: Payer: Self-pay

## 2020-02-02 NOTE — Progress Notes (Signed)
Chart reviewed by Pharmacist  Suzanne Walker PharmD, Contract Pharmacist at [facility]  

## 2020-02-04 ENCOUNTER — Ambulatory Visit: Payer: Self-pay

## 2020-02-12 ENCOUNTER — Encounter: Payer: Self-pay | Admitting: Family Medicine

## 2020-02-12 ENCOUNTER — Ambulatory Visit: Payer: Self-pay | Admitting: Family Medicine

## 2020-02-12 ENCOUNTER — Other Ambulatory Visit: Payer: Self-pay

## 2020-02-12 DIAGNOSIS — Z113 Encounter for screening for infections with a predominantly sexual mode of transmission: Secondary | ICD-10-CM

## 2020-02-12 LAB — WET PREP FOR TRICH, YEAST, CLUE
Trichomonas Exam: NEGATIVE
Yeast Exam: NEGATIVE

## 2020-02-12 NOTE — Progress Notes (Signed)
  [facility] Department STI clinic/screening visit  Subjective:  Cassandra Dixon is a 29 y.o. female being seen today for an STI screening visit. The patient reports they do not have symptoms.  Patient reports that they do not desire a pregnancy in the next year.   They reported they are not interested in discussing contraception today.  No LMP recorded.   Patient has the following medical conditions:  There are no problems to display for this patient.   Chief Complaint  Patient presents with  . SEXUALLY TRANSMITTED DISEASE    STD screening (no bloodwork)    HPI  Patient reports she is here to test of cure for gonorrhea treated in 12/2019.  Client states that she was sexually active with her ex-partner x2 since treatment.  Client states that he has another partner that was also treated for GC.  Client is concerned about other infections from this partner.  She denies STD symptoms.  See flowsheet for further details and programmatic requirements.    The following portions of the patient's history were reviewed and updated as appropriate: allergies, current medications, past medical history, past social history, past surgical history and problem list.  Objective:  There were no vitals filed for this visit.  Physical Exam Constitutional:      Appearance: Normal appearance.  Abdominal:     Hernia: There is no hernia in the left inguinal area or right inguinal area.  Genitourinary:    General: Normal vulva.     Pubic Area: No rash or pubic lice.      Labia:        Right: No rash, tenderness, lesion or injury.        Left: No rash, tenderness, lesion or injury.      Vagina: Normal. No vaginal discharge.     Cervix: Normal.     Rectum: Normal.     Comments: Bimanual deferred per client. Lymphadenopathy:     Cervical: No cervical adenopathy.     Lower Body: No right inguinal adenopathy. No left inguinal adenopathy.  Neurological:     Mental Status: She is alert.     Assessment and Plan:  LATANGELA MCCOMAS is a 29 y.o. female presenting to the [facility] Department for STI screening  1. Screening examination for venereal disease  - WET PREP FOR TRICH, YEAST, CLUE - Chlamydia/Gonorrhea [facility]. To use condoms for STD prevention.    No follow-ups on file.  No future appointments.  Larene Pickett, FNP

## 2020-02-12 NOTE — Progress Notes (Signed)
Wet mount reviewed, no tx per standing order and per provider verbal. Provider orders completed.

## 2020-05-11 ENCOUNTER — Ambulatory Visit: Payer: Self-pay

## 2020-07-15 ENCOUNTER — Ambulatory Visit: Payer: Self-pay

## 2020-07-21 ENCOUNTER — Ambulatory Visit: Payer: Self-pay

## 2020-07-22 ENCOUNTER — Ambulatory Visit: Payer: Self-pay

## 2020-07-28 ENCOUNTER — Other Ambulatory Visit: Payer: Self-pay

## 2020-07-28 ENCOUNTER — Ambulatory Visit: Payer: Self-pay

## 2020-07-28 ENCOUNTER — Encounter: Payer: Self-pay | Admitting: Family Medicine

## 2020-07-28 ENCOUNTER — Ambulatory Visit (LOCAL_COMMUNITY_HEALTH_CENTER): Payer: Self-pay | Admitting: Family Medicine

## 2020-07-28 VITALS — BP 109/75 | Ht 62.0 in | Wt 162.0 lb

## 2020-07-28 DIAGNOSIS — Z3009 Encounter for other general counseling and advice on contraception: Secondary | ICD-10-CM

## 2020-07-28 DIAGNOSIS — Z3046 Encounter for surveillance of implantable subdermal contraceptive: Secondary | ICD-10-CM

## 2020-07-28 LAB — WET PREP FOR TRICH, YEAST, CLUE
Trichomonas Exam: NEGATIVE
Yeast Exam: NEGATIVE

## 2020-07-28 NOTE — Progress Notes (Signed)
Wet mount reviewed, no tx per standing orders. Pt declined all forms of BC; condoms declined; pt desires pregnancy. Pt to go to pharmacy for vitamins containing folic acid; [facility] currently out of MVI stock. Pt knows she can RTC if she changes her mind about BC. Provider orders completed.

## 2020-07-28 NOTE — Progress Notes (Signed)
Family Planning Visit- Repeat Yearly Visit  Subjective:  Cassandra Dixon is a 29 y.o. G3P3003  being seen today for an well woman visit and to discuss family planning options.    She is currently using Nexplanon for pregnancy prevention. Patient reports she does and her partner wants a pregnancy in the next year. Patient  does not have any active problems on file.  Chief Complaint  Patient presents with  . Annual Exam  . Contraception    nexplanon removal, desires pregnancy    Patient reports she is here for her annual well-woman exam and to have Nexplanon removed.  She and partner plan on another pregnancy this year.  States that he live in [place].  She and children are going to visit him in September.  She wi also interested in an STD screen.  States she was treated for [facility] 12/2019.  She denies STD symptoms.  Patient denies no concerns.  See flowsheet for other program required questions.   Body mass index is 29.63 kg/m. - Patient is eligible for diabetes screening based on BMI and age >110?  not applicable HA1C ordered? not applicable  Patient reports 1 of partners in last year. Desires STI screening?  Yes   Has patient been screened once for HCV in the past?  No  No results found for: HCVAB  Does the patient have current of drug use, have a partner with drug use, and/or has been incarcerated since last result? No  If yes-- Screen for HCV through [facility]   Does the patient meet criteria for HBV testing? No  Criteria:  -Household, sexual or needle sharing contact with HBV -History of drug use -HIV positive -Those with known Hep C   Health Maintenance Due  Topic Date Due  . Hepatitis C Screening  Never done  . COVID-19 Vaccine (1) Never done  . TETANUS/TDAP  Never done  . PAP-Cervical Cytology Screening  03/18/2019  . PAP SMEAR-Modifier  03/18/2019  . INFLUENZA VACCINE  07/05/2020    Review of Systems  All other systems reviewed and are negative.   The  following portions of the patient's history were reviewed and updated as appropriate: allergies, current medications, past family history, past medical history, past social history, past surgical history and problem list. Problem list updated.  Objective:   Vitals:   07/28/20 1327  BP: 109/75  Weight: 162 lb (73.5 kg)  Height: 5\' 2"  (1.575 m)    Physical Exam Vitals and nursing note reviewed.  Constitutional:      Appearance: Normal appearance.  HENT:     Head: Normocephalic.  Cardiovascular:     Rate and Rhythm: Normal rate.  Pulmonary:     Effort: Pulmonary effort is normal.  Chest:     Breasts: Breasts are symmetrical.        Right: Normal.        Left: Normal.  Abdominal:     General: Abdomen is flat.     Palpations: Abdomen is soft.  Genitourinary:    General: Normal vulva.     Vagina: No vaginal discharge.  Musculoskeletal:        General: Normal range of motion.     Cervical back: Neck supple.  Lymphadenopathy:     Cervical: No cervical adenopathy.     Upper Body:     Right upper body: No supraclavicular, axillary or pectoral adenopathy.     Left upper body: No supraclavicular, axillary or pectoral adenopathy.  Skin:  General: Skin is warm and dry.  Neurological:     Mental Status: She is alert and oriented to person, place, and time. Mental status is at baseline.       Assessment and Plan:  Cassandra Dixon is a 29 y.o. female G3P3003 presenting to the [facility] Department for an yearly well woman exam/family planning visit  Contraception counseling: Reviewed all forms of birth control options in the tiered based approach. available including abstinence; over the counter/barrier methods; hormonal contraceptive medication including pill, patch, ring, injection,contraceptive implant, ECP; hormonal and nonhormonal IUDs; permanent sterilization options including vasectomy and the various tubal sterilization modalities. Risks, benefits, and typical  effectiveness rates were reviewed.  Questions were answered.  Written information was also given to the patient to review.  Patient desires no birth control method at this time. She will follow up in 1 year or PRN for surveillance.  She was told to call with any further questions, or with any concerns.  Emphasized use of condoms 100% of the time for STI prevention.  Patient was not an ECP candidate.   1. Family planning  - WET PREP FOR TRICH, YEAST, CLUE - Chlamydia/Gonorrhea [facility] - IGP, rfx Aptima HPV ASCU - Co client to begin taking Prenatal vits daily, healthy food choices, no ETOH and recommended Covid vaccination prior to travel.  2. Nexplanon removal Nexplanon Removal Patient identified, informed consent performed, consent signed.   Appropriate time out taken. Nexplanon site identified.  Area prepped in usual sterile fashon. 3 ml of 1% lidocaine with Epinephrine was used to anesthetize the area at the distal end of the implant and along implant site. A small stab incision was made right beside the implant on the distal portion.  The Nexplanon rod was grasped manually and removed without difficulty.  There was minimal blood loss. There were no complications.  Steri-strips were applied over the small incision.  A pressure bandage was applied to reduce any bruising.  The patient tolerated the procedure well and was given post procedure instructions.       No follow-ups on file.  No future appointments.  Cassandra Pickett, FNP

## 2020-07-28 NOTE — Progress Notes (Signed)
Pt scheduled for physical and nexplanon removal. Pt desires nexplanon removal today, desires pregnancy, declines all forms of birth control including condoms.

## 2020-08-02 LAB — IGP, RFX APTIMA HPV ASCU: PAP Smear Comment: 0

## 2020-08-02 LAB — HPV APTIMA: HPV Aptima: NEGATIVE

## 2020-08-06 ENCOUNTER — Encounter: Payer: Self-pay | Admitting: Advanced Practice Midwife

## 2020-08-06 DIAGNOSIS — R87619 Unspecified abnormal cytological findings in specimens from cervix uteri: Secondary | ICD-10-CM | POA: Insufficient documentation

## 2021-01-14 ENCOUNTER — Ambulatory Visit: Payer: Self-pay

## 2021-02-23 ENCOUNTER — Other Ambulatory Visit: Payer: Self-pay

## 2021-02-23 ENCOUNTER — Ambulatory Visit: Payer: Self-pay | Admitting: Physician Assistant

## 2021-02-23 ENCOUNTER — Encounter: Payer: Self-pay | Admitting: Physician Assistant

## 2021-02-23 DIAGNOSIS — Z113 Encounter for screening for infections with a predominantly sexual mode of transmission: Secondary | ICD-10-CM

## 2021-02-23 DIAGNOSIS — Z7189 Other specified counseling: Secondary | ICD-10-CM

## 2021-02-23 LAB — WET PREP FOR TRICH, YEAST, CLUE: Trichomonas Exam: NEGATIVE

## 2021-02-23 NOTE — Progress Notes (Signed)
[facility] Department STI clinic/screening visit  Subjective:  Cassandra Dixon is a 30 y.o. female being seen today for an STI screening visit. The patient reports they do not have symptoms.  Patient reports that they do not desire a pregnancy in the next year.   They reported they are not interested in discussing contraception today.  Patient's last menstrual period was 01/31/2021 (approximate).   Patient has the following medical conditions:   Patient Active Problem List   Diagnosis Date Noted  . Abnormal Pap smear of cervix 07/28/20 ASCUS HPV neg 08/06/2020    Chief Complaint  Patient presents with  . STD screen    HPI  Patient reports that she is not having any symptoms but would like a screening today.  Denies chronic conditions, surgeries and regular medicines.  States that her last HIV test was in 2021 and last pap was in 07/2020.   See flowsheet for further details and programmatic requirements.    The following portions of the patient's history were reviewed and updated as appropriate: allergies, current medications, past medical history, past social history, past surgical history and problem list.  Objective:  There were no vitals filed for this visit.  Physical Exam Constitutional:      General: She is not in acute distress.    Appearance: Normal appearance.  HENT:     Head: Normocephalic and atraumatic.     Comments: No nits,lice, or hair loss. No cervical, supraclavicular or axillary adenopathy.    Mouth/Throat:     Mouth: Mucous membranes are moist.     Pharynx: Oropharynx is clear. No oropharyngeal exudate or posterior oropharyngeal erythema.  Eyes:     Conjunctiva/sclera: Conjunctivae normal.  Pulmonary:     Effort: Pulmonary effort is normal.  Abdominal:     Palpations: Abdomen is soft. There is no mass.     Tenderness: There is no abdominal tenderness. There is no guarding or rebound.  Genitourinary:    General: Normal vulva.     Rectum:  Normal.     Comments: External genitalia/pubic area without nits, lice, edema, erythema, lesions and inguinal adenopathy. Vagina with normal mucosa and discharge. Cervix without visible lesions. Uterus firm, mobile, nt, no masses, no CMT, no adnexal tenderness or fullness. Musculoskeletal:     Cervical back: Neck supple. No tenderness.  Skin:    General: Skin is warm and dry.     Findings: No bruising, erythema, lesion or rash.  Neurological:     Mental Status: She is alert and oriented to person, place, and time.  Psychiatric:        Mood and Affect: Mood normal.        Behavior: Behavior normal.        Thought Content: Thought content normal.        Judgment: Judgment normal.      Assessment and Plan:  Cassandra Dixon is a 30 y.o. female presenting to the [facility] Department for STI screening  1. Screening for venereal disease Patient into clinic without symptoms. Rec condoms with all sex. Await test results.  Counseled that RN will call if needs to RTC for treatment once results are back. - WET PREP FOR TRICH, YEAST, CLUE - Chlamydia/Gonorrhea [facility]  2. Other specified counseling Patient with questions about her pap results from 07/2020. Counseled patient on possible causes for an ASCUS result and  that since her HPV test was negative, we will repeat her pap in 3 years.  No follow-ups on file.  No future appointments.  Jerene Dilling, PA

## 2021-02-23 NOTE — Progress Notes (Signed)
Declines HIV/RPR Richmond Campbell, RN  Pap ASCUS; HPV negative 07/28/2020  Wet mount reviewed with provider. No tx per SO Richmond Campbell, RN

## 2021-03-08 ENCOUNTER — Telehealth: Payer: Self-pay | Admitting: Family Medicine

## 2021-03-08 NOTE — Telephone Encounter (Signed)
Phone call to pt. Offered for pt to pick up one of our resource lists and also gave options and numbers from [facility] resource list. Pt declines to pick up hard copy of resource list at this time. Pt does not need any other services from [facility] at this time. Pt plans to call resources she is interested in.

## 2021-03-08 NOTE — Telephone Encounter (Signed)
Pt called seeking help finding pregnancy termination resources. Claims to be about one month along.

## 2021-05-12 ENCOUNTER — Other Ambulatory Visit: Payer: Self-pay

## 2021-05-12 ENCOUNTER — Encounter: Payer: Self-pay | Admitting: Obstetrics and Gynecology

## 2021-05-12 ENCOUNTER — Ambulatory Visit (INDEPENDENT_AMBULATORY_CARE_PROVIDER_SITE_OTHER): Payer: Self-pay | Admitting: Obstetrics and Gynecology

## 2021-05-12 VITALS — BP 120/68 | Ht 62.0 in | Wt 173.0 lb

## 2021-05-12 DIAGNOSIS — Z3169 Encounter for other general counseling and advice on procreation: Secondary | ICD-10-CM

## 2021-05-12 NOTE — Progress Notes (Signed)
Gynecology H&P  PCP: Patient, No Pcp Per (Inactive)  Chief Complaint:  Chief Complaint  Patient presents with  . Fertility    History of Present Illness: Patient is a 30 y.o. [id number] presenting for evaluation of infertility. Patient and partner have been attempting unprotected intercourse for since September 2021.  Pregnancies with current partner no  Sexual History Frequency: multiple time a week Dyspareunia: no Use of Lubricant: no Douching: no  Ovulatory Evaluation LMP: Patient's last menstrual period was 05/05/2021. Interval regular 28  Days she has only had one menstrual cycle slightly lighter than normal since her medical abortion in April Duration of flow: 4-5 days Heavy Menses: no Clots: no Intermenstrual Bleeding: no Dysmenorrhea: no  Molimina yes Ovulation Predictor Kits Positive  yes  PCOS  Wt Change: no Hirsutism: no Acne: no  Hyperprolactinemia Galactorrhea: no Headaches: no Vision Changes:  no  Prior treatments Meds: none Other Therapies: Not applicable  Female Factor Sired prior conception:  no Semen analysis: no No medical problems No drug or EtOH use  Contraception None discontinued Nexplanon August 2021  Review of Systems: 10 point review of systems negative unless otherwise noted in HPI  Past Medical History:  Patient Active Problem List   Diagnosis Date Noted  . Abnormal Pap smear of cervix 07/28/20 ASCUS HPV neg 08/06/2020    Repeat 3 years (07/2023)     Past Surgical History:  Past Surgical History:  Procedure Laterality Date  . NO PAST SURGERIES     Family History:  Family History  Problem Relation Age of Onset  . Hypertension Father   . Diabetes Father    Social History:  Social History   Socioeconomic History  . Marital status: Single    Spouse name: Not on file  . Number of children: Not on file  . Years of education: Not on file  . Highest education level: Not on file  Occupational History  . Not on file   Tobacco Use  . Smoking status: Former Games developer  . Smokeless tobacco: Never Used  Vaping Use  . Vaping Use: Never used  Substance and Sexual Activity  . Alcohol use: Not Currently  . Drug use: Not Currently    Types: Marijuana    Comment: last use about 1 year ago  . Sexual activity: Yes    Partners: Male    Birth control/protection: Condom  Other Topics Concern  . Not on file  Social History Narrative  . Not on file   Social Determinants of Health   Financial Resource Strain: Not on file  Food Insecurity: Not on file  Transportation Needs: Not on file  Physical Activity: Not on file  Stress: Not on file  Social Connections: Not on file  Intimate Partner Violence: Not At Risk  . Fear of Current or Ex-Partner: No  . Emotionally Abused: No  . Physically Abused: No  . Sexually Abused: No    Allergies:  Allergies  Allergen Reactions  . Shrimp [Shellfish Allergy] Shortness Of Breath and Swelling    Medications: Prior to Admission medications   Medication Sig Start Date End Date Taking? Authorizing Provider  etonogestrel (NEXPLANON) 68 MG IMPL implant 1 each by Subdermal route once. Patient not taking: Reported on 02/23/2021 08/21/17   Tessa Lerner, NP  ibuprofen (ADVIL) 800 MG tablet Take 800 mg by mouth every 8 (eight) hours as needed. 03/19/21   [provider]  Prenatal Vit-Fe Fumarate-FA (MULTIVITAMIN-PRENATAL) 27-0.8 MG TABS tablet Take 1 tablet by mouth  daily at 12 noon. Patient not taking: Reported on 07/28/2020    [provider]    Physical Exam Vitals: Blood pressure 120/68, height 5\' 2"  (1.575 m), weight 173 lb (78.5 kg), last menstrual period 05/05/2021.  General: NAD, well nourished, appears stated age HEENT: normocephalic, anicteric Pulmonary: No increased work of breathing Neurologic: Grossly intact Psychiatric: mood appropriate, affect full  * No order type specified *  Assessment: 30 y.o. 37 presenting for initial  infertility evaluatoin  Plan:  1) We discussed the underlying etiologies which may be implicated in a couple experiencing difficulty conceiving.  The average couple will conceive within the span of 1 year with unprotected coitus, with a monthly fecundity rate of 20% or 1 in 5.  Even without further work up or intervention the patient and her partner may be successful in conceiving unassisted, although if an underlying etiology can be identified and addressed fecundity rate may improve.  The work up entails examining for ovulatory function, tubal patency, and ruling out female factor infertility.  These may be looked at concurrently or sequentially.  The downside of sequential work up is that this method may miss issues if more than one compartment is contributing.  She is aware that tubal factor or moderate to severe female factor infertility will require further consultation with a reproductive endocrinologist.  In the case of anovulation, use of Clomid (clomiphen citrate) or Femara (letrazole) were discussed with the understanding the the later is an off-label, but well supported use.  With either of these drugs the risk of multiples increases from the standard population rate of 2% to approximately 10%, with higher order multiples possible but unlikely.  Both drugs may require some time to titrate to the appropriate dosage to ensure consistent ovulation.  Cycles will be limited to 6 cycles on each drug secondary to decreasing rates of conception after 6 cycles.  In addition should patient be started on ovulation induction with Clomid she was advised to discontinue the drug for any vision changes as this is a rare but potentially permanent side-effect if medication is continued.  We discussed timing of intercourse as well as the use of ovulation predictor kits identify the patient's fertile window each month.    - at present length of trying to conceive with current partner has been less than 12 months - The  patient did undergo a medical abortion April 2022, has been trying to conceive with current partner since September 2021.  Three living children not with current partner, no pregnancies with current partner.  Current partner has not sired and prior pregnancies, but has no health problems.    2) Preconception counseling - immunization up to date.  The patient denies any family history of conditions which would warrant preconception genetic counseling or testing on her or her partner.  Instructed to start prenatal vitamins while trying to conceive.    3) Healthcare maintenance -  pap 07/28/2020 ASCUS HPV negative  3) A total of 30 minutes were spent in face-to-face contact with the patient during this encounter with over half of that time devoted to counseling and coordination of care.  4) Return in about 1 year (around 05/12/2022), or if symptoms worsen or fail to improve.    07/12/2022, MD, [facility], [facility] 05/12/2021, 2:18 PM

## 2021-12-05 NOTE — L&D Delivery Note (Signed)
Delivery Note At 9:40 AM a viable female infant was delivered via Vaginal, Spontaneous (Presentation:  ROA).  APGAR: 8, 9; weight pending.   Placenta status: Spontaneous, Intact.  Cord: 3 vessels with the following complications: None;Short.  Cord pH: n/a  Anesthesia: None Episiotomy: None Lacerations: None Suture Repair:  n/a Est. Blood Loss (mL): 300  Mom to postpartum.  Baby to Couplet care / Skin to Skin.  Called to see patient.  Mom pushed to deliver a viable female infant.  The head followed by shoulders, which delivered without difficulty, and the rest of the body.  No nuchal cord noted.  Baby to mom's chest.  Cord clamped and cut after > 1 min delay.  Cord blood obtained.  Placenta delivered spontaneously, intact, with a 3-vessel cord.  No vaginal, cervical, or perineal lacerations. All counts correct.  Hemostasis obtained with IV pitocin and fundal massage. EBL 300 mL.    I was called emergently to deliver this precipitously delivered infant.    Thomasene Mohair, MD 07/12/2022, 9:58 AM

## 2022-01-03 ENCOUNTER — Other Ambulatory Visit: Payer: Self-pay

## 2022-01-03 ENCOUNTER — Ambulatory Visit (LOCAL_COMMUNITY_HEALTH_CENTER): Payer: Self-pay

## 2022-01-03 VITALS — BP 114/82 | Ht 62.0 in | Wt 179.5 lb

## 2022-01-03 DIAGNOSIS — Z3201 Encounter for pregnancy test, result positive: Secondary | ICD-10-CM

## 2022-01-03 LAB — PREGNANCY, URINE: Preg Test, Ur: POSITIVE — AB

## 2022-01-03 MED ORDER — PRENATAL 27-0.8 MG PO TABS: 1.0000 | ORAL_TABLET | Freq: Every day | ORAL | 0 refills | Status: AC

## 2022-01-03 NOTE — Progress Notes (Signed)
UPT positive. Plans prenatal care at [facility]. Pt has been living in [place] for past year,  but is visiting family in [place] currently.  Her husband and children are in [place] as her husband has a business there. Pt explains she may be moving back to [place] next month, but wanted to start prenatal care at [facility]. To clerk for preadmit. Jerel Shepherd, RN

## 2022-01-13 NOTE — Progress Notes (Signed)
OB abstract completed per phone interview.  Speaks English well.  Plans  to keep new OB appointment 01/14/22  8 am. Henriette Combs RN

## 2022-01-14 ENCOUNTER — Ambulatory Visit: Payer: Medicaid Other | Admitting: Advanced Practice Midwife

## 2022-01-14 ENCOUNTER — Other Ambulatory Visit: Payer: Self-pay

## 2022-01-14 VITALS — BP 100/60 | HR 82 | Temp 98.2°F | Wt 184.0 lb

## 2022-01-14 DIAGNOSIS — O0990 Supervision of high risk pregnancy, unspecified, unspecified trimester: Secondary | ICD-10-CM | POA: Insufficient documentation

## 2022-01-14 DIAGNOSIS — O0991 Supervision of high risk pregnancy, unspecified, first trimester: Secondary | ICD-10-CM | POA: Diagnosis not present

## 2022-01-14 DIAGNOSIS — B379 Candidiasis, unspecified: Secondary | ICD-10-CM

## 2022-01-14 DIAGNOSIS — O99320 Drug use complicating pregnancy, unspecified trimester: Secondary | ICD-10-CM

## 2022-01-14 DIAGNOSIS — F17200 Nicotine dependence, unspecified, uncomplicated: Secondary | ICD-10-CM

## 2022-01-14 DIAGNOSIS — O99210 Obesity complicating pregnancy, unspecified trimester: Secondary | ICD-10-CM

## 2022-01-14 HISTORY — DX: Supervision of high risk pregnancy, unspecified, unspecified trimester: O09.90

## 2022-01-14 LAB — HEMOGLOBIN, FINGERSTICK: Hemoglobin: 13.3 g/dL (ref 11.1–15.9)

## 2022-01-14 LAB — URINALYSIS
Bilirubin, UA: NEGATIVE
Glucose, UA: NEGATIVE
Ketones, UA: NEGATIVE
Nitrite, UA: NEGATIVE
Protein,UA: NEGATIVE
RBC, UA: NEGATIVE
Specific Gravity, UA: 1.01 (ref 1.005–1.030)
Urobilinogen, Ur: 0.2 mg/dL (ref 0.2–1.0)
pH, UA: 6 (ref 5.0–7.5)

## 2022-01-14 LAB — WET PREP FOR TRICH, YEAST, CLUE: Trichomonas Exam: NEGATIVE

## 2022-01-14 NOTE — Progress Notes (Signed)
[facility]   INITIAL PRENATAL VISIT NOTE  Subjective:  Cassandra Dixon is a 31 y.o. MHF smoker [id number] (6,5,4) at [redacted]w[redacted]d being seen today to start prenatal care at the [facility] Department.  She feels "happy" about planned pregnancy with no birth control since Nexplanon removed  07/28/20. 31 yo employed FOB feels "it's his first child"; he is living in [place] with her 3 children; in supportive  2 year relationship and he is of [place] descent. They were all living in [place] 07/2021 and she returned to [place] end of January 2023 to visit family. She will go back to [place] 01/31/22 to visit her 3 kids and FOB and return back to [place] with them. LMP ? 10/12/21. Her oldest child has a different father from her 2 youngest children. She is living with her mom.  Denies ER use or u/s this pregnancy. Last cig age 26. Denies vaping or cigars. Last MJ today. Last ETOH 09/2021(4 beers) qo weekend. Last pap 07/28/20 ASCUS HPV neg. Last dental exam age 17.  EAB 2022. C/o daily N&V. Last vomited yesterday 2x and 5 days before that. 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  Patient reports no complaints.  Contractions: Not present. Vag. Bleeding: None.  Movement: Absent. Denies leaking of fluid.   Indications for ASA therapy (per uptodate) One of the following: Previous pregnancy with preeclampsia, especially early onset and with an adverse outcome No Multifetal gestation No Chronic hypertension No Type 1 or 2 diabetes mellitus No Chronic kidney disease No Autoimmune disease (antiphospholipid syndrome, systemic lupus erythematosus) No  Two or more of the following: Nulliparity No Obesity (body mass  index >30 kg/m2) Yes Family history of preeclampsia in mother or sister No Age ?35 years No Sociodemographic characteristics (African American race, low socioeconomic level) No Personal risk factors (eg, previous pregnancy with low birth weight or small for gestational age infant, previous adverse pregnancy outcome [eg, stillbirth], interval >10 years between pregnancies) No   The following portions of the patient's history were reviewed and updated as appropriate: allergies, current medications, past family history, past medical history, past social history, past surgical history and problem list. Problem list updated.  Objective:   Vitals:   01/14/22 1332  BP: 100/60  Pulse: 82  Temp: 98.2 F (36.8 C)  Weight: 184 lb (83.5 kg)    Fetal Status: Fetal Heart Rate (bpm): 160 Fundal Height: 13 cm Movement: Absent  Presentation: Undeterminable   Physical Exam Vitals and nursing note reviewed.  Constitutional:      General: She is not in acute distress.    Appearance: Normal appearance. She is well-developed. She is obese.  HENT:     Head: Normocephalic and atraumatic.     Right Ear: External ear normal.     Left Ear: External ear normal.     Nose: Nose normal. No congestion or rhinorrhea.     Mouth/Throat:     Lips: Pink.     Mouth: Mucous membranes are moist.     Dentition: Normal dentition. No dental caries.     Pharynx: Oropharynx is clear. Uvula midline.     Comments: Dentition: fair; last dental exam age 49 Eyes:     General: No scleral icterus.  Conjunctiva/sclera: Conjunctivae normal.  Neck:     Thyroid: No thyroid mass, thyromegaly or thyroid tenderness.  Cardiovascular:     Rate and Rhythm: Normal rate.     Pulses: Normal pulses.     Comments: Extremities are warm and well perfused Pulmonary:     Effort: Pulmonary effort is normal.     Breath sounds: Normal breath sounds.  Chest:     Chest wall: No mass.  Breasts:    Tanner Score is 5.     Breasts are  symmetrical.     Right: Normal. No mass, nipple discharge or skin change.     Left: Normal. No mass, nipple discharge or skin change.  Abdominal:     Palpations: Abdomen is soft.     Tenderness: There is no abdominal tenderness.     Comments: Gravid, soft without masses or tenderness, FHR=160, 13 wks size  Genitourinary:    General: Normal vulva.     Exam position: Lithotomy position.     Pubic Area: No rash.      Labia:        Right: No rash.        Left: No rash.      Vagina: Vaginal discharge (white, creamy leukorrhea, ph<4.5) present.     Cervix: Normal.     Uterus: Normal. Enlarged (Gravid 13 wks size). Not tender.      Adnexa: Right adnexa normal and left adnexa normal.     Rectum: Normal. No external hemorrhoid.  Musculoskeletal:     Right lower leg: No edema.     Left lower leg: No edema.  Lymphadenopathy:     Cervical: No cervical adenopathy.     Upper Body:     Right upper body: No axillary adenopathy.     Left upper body: No axillary adenopathy.  Skin:    General: Skin is warm.     Capillary Refill: Capillary refill takes less than 2 seconds.  Neurological:     Mental Status: She is alert.    Assessment and Plan:  Pregnancy: [id number] at [redacted]w[redacted]d  1. Supervision of high risk pregnancy in first trimester Dating u/s ordered Please give pt dental list and encourage exam asap Declines genetic screening - HIV-1/HIV-2 Qualitative RNA - Prenatal profile without Varicella/Rubella [id number]) - HCV Ab w Reflex to Quant PCR - Urine Culture - Chlamydia/GC NAA, Confirmation - Glucose, 1 hour gestational - Hgb A1c w/o eAG - Comprehensive metabolic panel - Protein / creatinine ratio, urine  (Spot) - TSH - Lead, blood (adult age 69 yrs or greater) - QuantiFERON-TB Gold Plus [id number] Drug Screen - WET PREP FOR TRICH, YEAST, CLUE - Urinalysis (Urine Dip) - Hemoglobin, venipuncture - US OB Comp Less 14 Wks; Future  2. 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 Obesity affecting pregnancy, antepartum Agrees to ASA 81 mg daily 1 hour glucola today Counseled on weight gain of 11-20 lbs this pregnancy    Discussed overview of care and coordination with inpatient delivery practices including [facility], [facility], [facility] and [facility].   Reviewed Centering pregnancy as standard of care at [facility]   Preterm labor symptoms and general obstetric precautions including but not limited to vaginal bleeding, contractions, leaking of fluid and fetal movement were reviewed in detail with the patient.  Please refer to After Visit Summary for other counseling recommendations.   Return in about 4 weeks (around 02/11/2022) for routine PNC.  No future appointments.  Herbie Saxon, CNM

## 2022-01-15 LAB — HIV-1/HIV-2 QUALITATIVE RNA
HIV-1 RNA, Qualitative: NONREACTIVE
HIV-2 RNA, Qualitative: NONREACTIVE

## 2022-01-15 LAB — PROTEIN / CREATININE RATIO, URINE
Creatinine, Urine: 57.8 mg/dL
Protein, Ur: 6.6 mg/dL
Protein/Creat Ratio: 114 mg/g creat (ref 0–200)

## 2022-01-17 LAB — URINE CULTURE

## 2022-01-17 LAB — LEAD, BLOOD (ADULT >= 16 YRS): Lead-Whole Blood: <1 ug/dL (ref 0.0–3.4)

## 2022-01-18 ENCOUNTER — Telehealth: Payer: Self-pay

## 2022-01-18 LAB — COMPREHENSIVE METABOLIC PANEL
ALT: 11 IU/L (ref 0–32)
AST: 11 IU/L (ref 0–40)
Albumin/Globulin Ratio: 1.4 (ref 1.2–2.2)
Albumin: 3.9 g/dL (ref 3.9–5.0)
Alkaline Phosphatase: 54 IU/L (ref 44–121)
BUN/Creatinine Ratio: 10 (ref 9–23)
BUN: 5 mg/dL — ABNORMAL LOW (ref 6–20)
Bilirubin Total: <0.2 mg/dL (ref 0.0–1.2)
CO2: 20 mmol/L (ref 20–29)
Calcium: 9.5 mg/dL (ref 8.7–10.2)
Chloride: 100 mmol/L (ref 96–106)
Creatinine, Ser: 0.49 mg/dL — ABNORMAL LOW (ref 0.57–1.00)
Globulin, Total: 2.7 g/dL (ref 1.5–4.5)
Glucose: 90 mg/dL (ref 70–99)
Potassium: 3.7 mmol/L (ref 3.5–5.2)
Sodium: 136 mmol/L (ref 134–144)
Total Protein: 6.6 g/dL (ref 6.0–8.5)
eGFR: 130 mL/min/{1.73_m2} (ref >59)

## 2022-01-18 LAB — CBC/D/PLT+RPR+RH+ABO+AB SCR
Antibody Screen: NEGATIVE
Basophils Absolute: 0 10*3/uL (ref 0.0–0.2)
Basos: 1 %
EOS (ABSOLUTE): 0.1 10*3/uL (ref 0.0–0.4)
Eos: 2 %
Hematocrit: 38.9 % (ref 34.0–46.6)
Hemoglobin: 13.1 g/dL (ref 11.1–15.9)
Hepatitis B Surface Ag: NEGATIVE
Immature Grans (Abs): 0 10*3/uL (ref 0.0–0.1)
Immature Granulocytes: 1 %
Lymphocytes Absolute: 1.4 10*3/uL (ref 0.7–3.1)
Lymphs: 19 %
MCH: 30.2 pg (ref 26.6–33.0)
MCHC: 33.7 g/dL (ref 31.5–35.7)
MCV: 90 fL (ref 79–97)
Monocytes Absolute: 0.5 10*3/uL (ref 0.1–0.9)
Monocytes: 6 %
Neutrophils Absolute: 5.3 10*3/uL (ref 1.4–7.0)
Neutrophils: 71 %
Platelets: 294 10*3/uL (ref 150–450)
RBC: 4.34 x10E6/uL (ref 3.77–5.28)
RDW: 13 % (ref 11.7–15.4)
RPR Ser Ql: NONREACTIVE
Rh Factor: POSITIVE
WBC: 7.4 10*3/uL (ref 3.4–10.8)

## 2022-01-18 LAB — QUANTIFERON-TB GOLD PLUS
QuantiFERON Mitogen Value: >10 IU/mL
QuantiFERON Nil Value: 0.03 IU/mL
QuantiFERON TB1 Ag Value: 0.02 IU/mL
QuantiFERON TB2 Ag Value: 0.02 IU/mL
QuantiFERON-TB Gold Plus: NEGATIVE

## 2022-01-18 LAB — CHLAMYDIA/GC NAA, CONFIRMATION
Chlamydia trachomatis, NAA: NEGATIVE
Neisseria gonorrhoeae, NAA: NEGATIVE

## 2022-01-18 LAB — TSH: TSH: 1.37 u[IU]/mL (ref 0.450–4.500)

## 2022-01-18 LAB — GLUCOSE, 1 HOUR GESTATIONAL: Gestational Diabetes Screen: 92 mg/dL (ref 70–139)

## 2022-01-18 LAB — HCV AB W REFLEX TO QUANT PCR: HCV Ab: <0.1 s/co ratio (ref 0.0–0.9)

## 2022-01-18 LAB — HGB A1C W/O EAG: Hgb A1c MFr Bld: 5.2 % (ref 4.8–5.6)

## 2022-01-18 LAB — HCV INTERPRETATION

## 2022-01-18 MED ORDER — CLOTRIMAZOLE 1 % VA CREA: 1.0000 | TOPICAL_CREAM | Freq: Every day | VAGINAL | 0 refills | Status: AC

## 2022-01-18 MED ORDER — PRENATAL MULTIVITAMIN CH: 1.0000 | ORAL_TABLET | Freq: Every day | ORAL | 0 refills | Status: DC

## 2022-01-18 NOTE — Addendum Note (Signed)
Addended by: Adalberto Cole on: 01/18/2022 08:53 AM   Modules accepted: Orders

## 2022-01-18 NOTE — Addendum Note (Signed)
Addended by: Floy Sabina on: 01/18/2022 11:49 AM   Modules accepted: Orders

## 2022-01-18 NOTE — Progress Notes (Signed)
Hgb and urinalysis reviewed during clinic visit - no treatment indicated.   Wet mount reviewed - treatment given for yeast per SO. Instructions and education given.   PNV dispensed to patient as she will be out the country to [place] from 01/31/22 until May. PNV dispensed today and what she was given in her pregnancy test visit will cover her upon return.   Floy Sabina, RN

## 2022-01-18 NOTE — Telephone Encounter (Signed)
Patient made aware that [facility] [place]) have no ultrasound appointment available before patient leaves to [place] on 01/31/22. Patient is leaving to [place] to visit her 3 children and husband who have a business there. She plans to stay there until May but unsure when in May she will return exactly.   Patient verbalized understanding.   Ultrasound scheduled with [facility] MFM with Britta Mccreedy to have patient scheduled for May upon patient's return. Patient unsure when in May she will return so scheduled [place] towards end of May on the 23rd at 1pm. Patient given address and appointment time. Patient aware to arrive 15 minutes early.   Patient also verbalized that she will call us as soon as she has a return date back to the [place] so we can schedule her next MH RV.   Floy Sabina, RN

## 2022-01-24 ENCOUNTER — Encounter: Payer: Self-pay | Admitting: Advanced Practice Midwife

## 2022-01-24 DIAGNOSIS — R825 Elevated urine levels of drugs, medicaments and biological substances: Secondary | ICD-10-CM | POA: Insufficient documentation

## 2022-01-24 LAB — 789231 7+OXYCODONE-BUND
Amphetamines, Urine: NEGATIVE ng/mL
BENZODIAZ UR QL: NEGATIVE ng/mL
Barbiturate screen, urine: NEGATIVE ng/mL
Cocaine (Metab.): NEGATIVE ng/mL
OPIATE SCREEN URINE: NEGATIVE ng/mL
Oxycodone/Oxymorphone, Urine: NEGATIVE ng/mL
PCP Quant, Ur: NEGATIVE ng/mL

## 2022-01-24 LAB — CANNABINOID CONFIRMATION, UR
CANNABINOIDS: POSITIVE — AB
Carboxy THC GC/MS Conf: 80 ng/mL

## 2022-02-22 ENCOUNTER — Ambulatory Visit: Payer: Self-pay

## 2022-03-31 ENCOUNTER — Ambulatory Visit: Payer: Medicaid Other

## 2022-04-05 ENCOUNTER — Ambulatory Visit: Payer: Medicaid Other | Admitting: Advanced Practice Midwife

## 2022-04-05 VITALS — BP 98/61 | HR 64 | Temp 97.7°F | Wt 184.4 lb

## 2022-04-05 DIAGNOSIS — O99210 Obesity complicating pregnancy, unspecified trimester: Secondary | ICD-10-CM

## 2022-04-05 DIAGNOSIS — R825 Elevated urine levels of drugs, medicaments and biological substances: Secondary | ICD-10-CM

## 2022-04-05 DIAGNOSIS — O0991 Supervision of high risk pregnancy, unspecified, first trimester: Secondary | ICD-10-CM

## 2022-04-05 DIAGNOSIS — R87610 Atypical squamous cells of undetermined significance on cytologic smear of cervix (ASC-US): Secondary | ICD-10-CM

## 2022-04-05 LAB — URINALYSIS
Bilirubin, UA: NEGATIVE
Glucose, UA: NEGATIVE
Ketones, UA: NEGATIVE
Nitrite, UA: NEGATIVE
Protein,UA: NEGATIVE
RBC, UA: NEGATIVE
Specific Gravity, UA: 1.02 (ref 1.005–1.030)
Urobilinogen, Ur: 0.2 mg/dL (ref 0.2–1.0)
pH, UA: 7 (ref 5.0–7.5)

## 2022-04-05 NOTE — Progress Notes (Addendum)
Here today for 25.0 weeks. Taking PNV QD. Denies ED/hospital visits since last RV. Patient states she has just returned from [place]. States she has been in [place] since February/2023. Needs Quant Gold today. PTL s/s info given and reviewed. Has [facility] scan scheduled for 04/26/22 @ 1:00 and is aware. Tawny Hopping, RN ? ?

## 2022-04-05 NOTE — Progress Notes (Signed)
[facility] Department ?Maternal Health Clinic ? ?PRENATAL VISIT NOTE ? ?Subjective:  ?Cassandra Dixon is a 31 y.o. [phone] at [redacted]w[redacted]d being seen today for ongoing prenatal care.  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 ? ?Patient reports  low abdominal cramping occassionally .  Contractions: Not present. Vag. Bleeding: None.  Movement: Present. Denies leaking of fluid/ROM.  ? ?The following portions of the patient's history were reviewed and updated as appropriate: allergies, current medications, past family history, past medical history, past social history, past surgical history and problem list. Problem list updated. ? ?Objective:  ? ?Vitals:  ? 04/05/22 1005  ?BP: 98/61  ?Pulse: 64  ?Temp: 97.7 ?F (36.5 ?C)  ?Weight: 184 lb 6.4 oz (83.6 kg)  ? ? ?Fetal Status: Fetal Heart Rate (bpm): 150 Fundal Height: 25 cm Movement: Present    ? ?General:  Alert, oriented and cooperative. Patient is in no acute distress.  ?Skin: Skin is warm and dry. No rash noted.   ?Cardiovascular: Normal heart rate noted  ?Respiratory: Normal respiratory effort, no problems with respiration noted  ?Abdomen: Soft, gravid, appropriate for gestational age.  Pain/Pressure: Absent     ?Pelvic: Cervical exam deferred        ?Extremities: Normal range of motion.  Edema: None  ?Mental Status: Normal mood and affect. Normal behavior. Normal judgment and thought content.  ? ?Assessment and Plan:  ?Pregnancy: [id number] at [redacted]w[redacted]d ? ?1. Supervision of high risk pregnancy in first trimester ?Has been to [place] since 01/2022 visiting FOB and her 3 kids (78, 52, 4) who are living with FOB (not their biological father). Returned 03/23/22 without the kids  because they are in private school there. She will return to join them after baby is born ?Has not had u/s yet--scheduled anatomy u/s 04/26/22 ?Living with her mom and not working ?No weight gain since last apt; pt states no appetite and just stopped N&V ?C/o occasional low abdominal cramps; C&S done today ?- QuantiFERON-TB Gold Plus ?- [id number] Drug Screen ?- Urinalysis (Urine Dip) ? ?2. Obesity affecting pregnancy, antepartum ?14 lb 6.4 oz (6.532 kg) ?Not exercising ?Not taking ASA 81 mg ? ?3. Atypical squamous cells of undetermined significance on cytologic smear of cervix (ASC-US) ?Repeat 07/2023 ? ?4. Positive urine drug screen +MJ 01/14/22 ?States last use was 03/23/22 when she returned from [place] ?Agrees to UDS today ? ? ?Preterm labor symptoms and general obstetric precautions including but not limited to vaginal bleeding, contractions, leaking of fluid and fetal movement were reviewed in detail with the patient. ?Please refer to After Visit Summary for other counseling recommendations.  ?Return in about 2 weeks (around 04/19/2022) for 28 week labs, routine PNC. ? ?Future Appointments  ?Date Time Provider [place]  ?04/26/2022  1:00 PM [facility]  ? ? ?Herbie Saxon, CNM ? ?

## 2022-04-07 LAB — URINE CULTURE

## 2022-04-09 LAB — QUANTIFERON-TB GOLD PLUS
QuantiFERON Mitogen Value: >10 IU/mL
QuantiFERON Nil Value: 0.01 IU/mL
QuantiFERON TB1 Ag Value: 0 IU/mL
QuantiFERON TB2 Ag Value: 0.01 IU/mL
QuantiFERON-TB Gold Plus: NEGATIVE

## 2022-04-12 LAB — 789231 7+OXYCODONE-BUND
Amphetamines, Urine: NEGATIVE ng/mL
BENZODIAZ UR QL: NEGATIVE ng/mL
Barbiturate screen, urine: NEGATIVE ng/mL
Cocaine (Metab.): NEGATIVE ng/mL
OPIATE SCREEN URINE: NEGATIVE ng/mL
Oxycodone/Oxymorphone, Urine: NEGATIVE ng/mL
PCP Quant, Ur: NEGATIVE ng/mL

## 2022-04-12 LAB — CANNABINOID CONFIRMATION, UR
CANNABINOIDS: POSITIVE — AB
Carboxy THC GC/MS Conf: 92 ng/mL

## 2022-04-20 ENCOUNTER — Ambulatory Visit: Payer: Medicaid Other | Admitting: Family Medicine

## 2022-04-20 VITALS — BP 94/65 | HR 68 | Temp 97.4°F | Wt 189.6 lb

## 2022-04-20 DIAGNOSIS — O0993 Supervision of high risk pregnancy, unspecified, third trimester: Secondary | ICD-10-CM

## 2022-04-20 DIAGNOSIS — Z23 Encounter for immunization: Secondary | ICD-10-CM

## 2022-04-20 DIAGNOSIS — O0991 Supervision of high risk pregnancy, unspecified, first trimester: Secondary | ICD-10-CM

## 2022-04-20 LAB — HEMOGLOBIN, FINGERSTICK: Hemoglobin: 12.1 g/dL (ref 11.1–15.9)

## 2022-04-20 NOTE — Progress Notes (Signed)
Hgb 12.1 today. Tawny Hopping, RN ? ?

## 2022-04-20 NOTE — Progress Notes (Signed)
Here today for 27.1 week MH RV. Taking PNV QD. Denies ED/hospital visits since last RV. Aware of 04/26/22 [facility] scan. 28 week labs and Tdap today. Tawny Hopping, RN ? ?

## 2022-04-21 LAB — GLUCOSE, 1 HOUR GESTATIONAL: Gestational Diabetes Screen: 60 mg/dL — ABNORMAL LOW (ref 70–139)

## 2022-04-21 LAB — HIV-1/HIV-2 QUALITATIVE RNA
HIV-1 RNA, Qualitative: NONREACTIVE
HIV-2 RNA, Qualitative: NONREACTIVE

## 2022-04-24 NOTE — Progress Notes (Signed)
[place] [facility] M[facility] Clinic  PRENATAL VISIT NOTE  Subjective:  Cassandra Dixon is a 31 y.o. [phone] at [redacted]w[redacted]d being seen today for ongoing prenatal care.  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  Patient reports no complaints.  Contractions: Not present. Vag. Bleeding: None.  Movement: Present. Denies leaking of fluid/ROM.   The following portions of the patient's history were reviewed and updated as appropriate: allergies, current medications, past family history, past medical history, past social history, past surgical history and problem list. Problem list updated.  Objective:   Vitals:   04/20/22 0959  BP: 94/65  Pulse: 68  Temp: (!) 97.4 F (36.3 C)  Weight: 189 lb 9.6 oz (86 kg)    Fetal Status: Fetal Heart Rate (bpm): 130 Fundal Height: 26 cm Movement: Present     General:  Alert, oriented and cooperative. Patient is in no acute distress.  Skin: Skin is warm and dry. No rash noted.   Cardiovascular: Normal heart rate noted  Respiratory: Normal respiratory effort, no problems with respiration noted  Abdomen: Soft, gravid, appropriate for gestational age.  Pain/Pressure: Absent     Pelvic: Cervical exam deferred        Extremities: Normal range of motion.  Edema: None  Mental Status: Normal mood and affect. Normal behavior. Normal judgment and thought content.   Assessment and Plan:  Pregnancy: [id number] at [redacted]w[redacted]d  1. Supervision of high risk pregnancy in first trimester -TWG- 19 lbs  -Taking PNV as directed  -28 wk labs today.  -Reviewed prenatal visit schedule q2 wks until 36 wks, then q1 wk.  -Discussed option of doula during  delivery.  [facility] [place] pamphlet given, advised pt to call for more information.   -Pt has MFM [place] appointment on 5/23, pt aware.  -Pt still undecided about PP BCM and Peds provider   - Glucose, 1 hour gestational - HIV-1/HIV-2 Qualitative RNA - RPR - Hemoglobin, venipuncture   Preterm labor symptoms and general obstetric precautions including but not limited to vaginal bleeding, contractions, leaking of fluid and fetal movement were reviewed in detail with the patient. Please refer to After Visit Summary for other counseling recommendations.  No follow-ups on file.  Future Appointments  Date Time Provider [place]  04/26/2022  1:00 PM [facility]  05/04/2022 10:40 AM [facility] PROVIDER [facility] None    Junious Dresser, FNP

## 2022-04-26 ENCOUNTER — Ambulatory Visit: Payer: Medicaid Other | Attending: Maternal & Fetal Medicine

## 2022-04-26 ENCOUNTER — Other Ambulatory Visit: Payer: Self-pay

## 2022-04-26 ENCOUNTER — Encounter: Payer: Self-pay | Admitting: Advanced Practice Midwife

## 2022-04-26 ENCOUNTER — Other Ambulatory Visit: Payer: Self-pay | Admitting: Advanced Practice Midwife

## 2022-04-26 DIAGNOSIS — O99323 Drug use complicating pregnancy, third trimester: Secondary | ICD-10-CM

## 2022-04-26 DIAGNOSIS — E669 Obesity, unspecified: Secondary | ICD-10-CM

## 2022-04-26 DIAGNOSIS — F1290 Cannabis use, unspecified, uncomplicated: Secondary | ICD-10-CM

## 2022-04-26 DIAGNOSIS — Z3A29 29 weeks gestation of pregnancy: Secondary | ICD-10-CM

## 2022-04-26 DIAGNOSIS — O0991 Supervision of high risk pregnancy, unspecified, first trimester: Secondary | ICD-10-CM

## 2022-04-26 DIAGNOSIS — Z3A00 Weeks of gestation of pregnancy not specified: Secondary | ICD-10-CM | POA: Diagnosis not present

## 2022-04-26 DIAGNOSIS — O99213 Obesity complicating pregnancy, third trimester: Secondary | ICD-10-CM | POA: Diagnosis not present

## 2022-05-04 ENCOUNTER — Ambulatory Visit: Payer: Medicaid Other | Admitting: Advanced Practice Midwife

## 2022-05-04 VITALS — BP 93/63 | HR 75 | Temp 98.1°F | Wt 190.2 lb

## 2022-05-04 DIAGNOSIS — F1290 Cannabis use, unspecified, uncomplicated: Secondary | ICD-10-CM

## 2022-05-04 DIAGNOSIS — R825 Elevated urine levels of drugs, medicaments and biological substances: Secondary | ICD-10-CM

## 2022-05-04 DIAGNOSIS — O99210 Obesity complicating pregnancy, unspecified trimester: Secondary | ICD-10-CM

## 2022-05-04 DIAGNOSIS — O99320 Drug use complicating pregnancy, unspecified trimester: Secondary | ICD-10-CM

## 2022-05-04 DIAGNOSIS — O0991 Supervision of high risk pregnancy, unspecified, first trimester: Secondary | ICD-10-CM

## 2022-05-04 NOTE — Progress Notes (Signed)
Here today for 30.3 week MH RV. Taking PNV QD. Denies ED/hospital visits since last RV. Kick Count cards and instructions given. Has [facility] [place] appt 05/31/22 @ 11:00. Tawny Hopping, RN

## 2022-05-04 NOTE — Progress Notes (Signed)
[place] Department Maternal Health Clinic  PRENATAL VISIT NOTE  Subjective:  Cassandra Dixon is a 31 y.o. [phone] at [redacted]w[redacted]d being seen today for ongoing prenatal care.  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  Patient reports no complaints.  Contractions: Not present. Vag. Bleeding: None.  Movement: Present. Denies leaking of fluid/ROM.   The following portions of the patient's history were reviewed and updated as appropriate: allergies, current medications, past family history, past medical history, past social history, past surgical history and problem list. Problem list updated.  Objective:   Vitals:   05/04/22 1059  BP: 93/63  Pulse: 75  Temp: 98.1 F (36.7 C)  Weight: 190 lb 3.2 oz (86.3 kg)    Fetal Status: Fetal Heart Rate (bpm): 130 Fundal Height: 31 cm Movement: Present     General:  Alert, oriented and cooperative. Patient is in no acute distress.  Skin: Skin is warm and dry. No rash noted.   Cardiovascular: Normal heart rate noted  Respiratory: Normal respiratory effort, no problems with respiration noted  Abdomen: Soft, gravid, appropriate for gestational age.  Pain/Pressure: Absent     Pelvic: Cervical exam deferred        Extremities: Normal range of motion.  Edema: None  Mental Status: Normal mood and affect. Normal behavior. Normal judgment and thought content.   Assessment and Plan:  Pregnancy: [id number] at [redacted]w[redacted]d  1. Supervision of high risk pregnancy in first trimester Not working Her husband and kids living in [place] but as soon as school is finished there they are joining her here Living with mom, dad, 2 brothers 1 hour glucola on  04/20/22=60. Reviewed 04/26/22 u/s at 29 2/7 with anterior placenta, AFI wnl, anatomy wnl, EFW=29%, EDC changed to 07/10/22 Has f/u u/s to confirm dates and check interval growth on 05/31/22  2. Positive urine drug screen +MJ 01/14/22; +MJ 04/05/22 Was using 4-5x/day; states last use 04/19/22 and vomits if doesn't take MJ Pt concerned she has no appetite unless she uses MJ Plan of safe care pamphlet given and discussed with pt Suggestions given for N&V and to eat small high protein snacks q 2 hours  3. Obesity affecting pregnancy, antepartum Refuses to take ASA 81 mg 20 lb 3.2 oz (9.163 kg) Walking 1x/wk x 15 min   Preterm labor symptoms and general obstetric precautions including but not limited to vaginal bleeding, contractions, leaking of fluid and fetal movement were reviewed in detail with the patient. Please refer to After Visit Summary for other counseling recommendations.  Return in about 2 weeks (around 05/18/2022) for routine PNC.  Future Appointments  Date Time Provider [place]  05/18/2022 10:40 AM [facility] PROVIDER [facility] None  05/31/2022 11:00 AM [facility] [place], CNM

## 2022-05-16 LAB — RPR

## 2022-05-16 LAB — SPECIMEN STATUS REPORT

## 2022-05-16 NOTE — Progress Notes (Signed)
TC to [facility] to follow-up on lab tests done 04/20/22 with no results viewable. Patient had her 28 week labs done on 04/20/2022, all results in except result for RPR. RPR was ordered in clinic. Per Morrie Sheldon in [employer], RPR was not entered by phlebotomist at time of service. No RPR was drawn. Morrie Sheldon has entered this as a "missed test" in her notes. Patient will need RPR drawn at next appointment on 05/18/2022.Marland KitchenBurt Knack, RN

## 2022-05-17 ENCOUNTER — Telehealth: Payer: Self-pay

## 2022-05-17 NOTE — Telephone Encounter (Signed)
Telephone call to patient regarding the need to redraw her RPR at tomorrows appointment 05-18-22.  Phone rang and rang with no answer today.  Hart Carwin, RN

## 2022-05-18 ENCOUNTER — Ambulatory Visit: Payer: Medicaid Other | Admitting: Advanced Practice Midwife

## 2022-05-18 VITALS — BP 102/67 | HR 80 | Temp 97.0°F | Wt 193.4 lb

## 2022-05-18 DIAGNOSIS — R87610 Atypical squamous cells of undetermined significance on cytologic smear of cervix (ASC-US): Secondary | ICD-10-CM

## 2022-05-18 DIAGNOSIS — O99210 Obesity complicating pregnancy, unspecified trimester: Secondary | ICD-10-CM

## 2022-05-18 DIAGNOSIS — R825 Elevated urine levels of drugs, medicaments and biological substances: Secondary | ICD-10-CM

## 2022-05-18 DIAGNOSIS — O99213 Obesity complicating pregnancy, third trimester: Secondary | ICD-10-CM

## 2022-05-18 DIAGNOSIS — O0993 Supervision of high risk pregnancy, unspecified, third trimester: Secondary | ICD-10-CM

## 2022-05-18 DIAGNOSIS — O0991 Supervision of high risk pregnancy, unspecified, first trimester: Secondary | ICD-10-CM

## 2022-05-18 NOTE — Progress Notes (Signed)
[facility] Department Maternal Health Clinic  PRENATAL VISIT NOTE  Subjective:  Cassandra Dixon is a 31 y.o. [phone] at [redacted]w[redacted]d being seen today for ongoing prenatal care.  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  Patient reports no complaints.  Contractions: Not present. Vag. Bleeding: None.  Movement: Present. Denies leaking of fluid/ROM.   The following portions of the patient's history were reviewed and updated as appropriate: allergies, current medications, past family history, past medical history, past social history, past surgical history and problem list. Problem list updated.  Objective:   Vitals:   05/18/22 1117  BP: 102/67  Pulse: 80  Temp: (!) 97 F (36.1 C)  Weight: 193 lb 6.4 oz (87.7 kg)    Fetal Status: Fetal Heart Rate (bpm): 130 Fundal Height: 32 cm Movement: Present     General:  Alert, oriented and cooperative. Patient is in no acute distress.  Skin: Skin is warm and dry. No rash noted.   Cardiovascular: Normal heart rate noted  Respiratory: Normal respiratory effort, no problems with respiration noted  Abdomen: Soft, gravid, appropriate for gestational age.  Pain/Pressure: Absent     Pelvic: Cervical exam deferred        Extremities: Normal range of motion.  Edema: None  Mental Status: Normal mood and affect. Normal behavior. Normal judgment and thought content.   Assessment and Plan:  Pregnancy: [id number] at [redacted]w[redacted]d  1. Supervision of high risk pregnancy in third trimester Has f/u u/s 05/31/22 Husband and kids are coming from [place] early August to join her after school is finished for the year Not working Living with her mom, dad, 2  brothers - RPR    3. Obesity affecting pregnancy, antepartum 23 lb 6.4 oz (10.6 kg) Walking 1x/wk x 15 min 3 lb wt gain in past 2 wks Not taking ASA 81 mg daily  4. Positive urine drug screen +MJ 01/14/22; +MJ 04/05/22 States last use 05/04/22 To do UDS next apt  5. Atypical squamous cells of undetermined significance on cytologic smear of cervix (ASC-US) Repeat 07/2023   Preterm labor symptoms and general obstetric precautions including but not limited to vaginal bleeding, contractions, leaking of fluid and fetal movement were reviewed in detail with the patient. Please refer to After Visit Summary for other counseling recommendations.  No follow-ups on file.  Future Appointments  Date Time Provider Department Center  05/31/2022 11:00 AM [facility]  06/01/2022 10:20 AM [facility] PROVIDER [facility] None    Alberteen Spindle, CNM

## 2022-05-18 NOTE — Progress Notes (Addendum)
Patient here for MH RV at 32 3/7. Kick counts reviewed and cards given. Needs blood draw today for RPR. Test was ordered at 28 week lab visit, lab was not done however. Patient aware of [facility] U/S on 05/31/2022 at 11:00. Still unsure about BCM .Jenetta Downer, RN

## 2022-05-19 LAB — RPR: RPR Ser Ql: NONREACTIVE

## 2022-05-20 NOTE — Telephone Encounter (Signed)
Per Epic, RPR re-draw was discussed and looks like patient had it done at her visit 05-18-2022.  Hart Carwin, RN

## 2022-05-24 ENCOUNTER — Other Ambulatory Visit: Payer: Self-pay

## 2022-05-24 DIAGNOSIS — F1290 Cannabis use, unspecified, uncomplicated: Secondary | ICD-10-CM

## 2022-05-24 DIAGNOSIS — O99213 Obesity complicating pregnancy, third trimester: Secondary | ICD-10-CM

## 2022-05-26 ENCOUNTER — Ambulatory Visit: Payer: Medicaid Other | Attending: Maternal & Fetal Medicine

## 2022-05-26 ENCOUNTER — Other Ambulatory Visit: Payer: Self-pay

## 2022-05-26 DIAGNOSIS — O99213 Obesity complicating pregnancy, third trimester: Secondary | ICD-10-CM | POA: Insufficient documentation

## 2022-05-26 DIAGNOSIS — F1290 Cannabis use, unspecified, uncomplicated: Secondary | ICD-10-CM | POA: Diagnosis not present

## 2022-05-26 DIAGNOSIS — O99323 Drug use complicating pregnancy, third trimester: Secondary | ICD-10-CM | POA: Diagnosis not present

## 2022-05-26 DIAGNOSIS — Z363 Encounter for antenatal screening for malformations: Secondary | ICD-10-CM | POA: Insufficient documentation

## 2022-05-26 DIAGNOSIS — Z3A33 33 weeks gestation of pregnancy: Secondary | ICD-10-CM | POA: Insufficient documentation

## 2022-05-26 DIAGNOSIS — O99320 Drug use complicating pregnancy, unspecified trimester: Secondary | ICD-10-CM

## 2022-05-31 ENCOUNTER — Ambulatory Visit: Payer: Medicaid Other

## 2022-06-01 ENCOUNTER — Ambulatory Visit: Payer: Medicaid Other | Admitting: Advanced Practice Midwife

## 2022-06-01 VITALS — BP 98/62 | HR 77 | Temp 97.0°F | Wt 193.6 lb

## 2022-06-01 DIAGNOSIS — R87610 Atypical squamous cells of undetermined significance on cytologic smear of cervix (ASC-US): Secondary | ICD-10-CM

## 2022-06-01 DIAGNOSIS — O99320 Drug use complicating pregnancy, unspecified trimester: Secondary | ICD-10-CM

## 2022-06-01 DIAGNOSIS — F1290 Cannabis use, unspecified, uncomplicated: Secondary | ICD-10-CM

## 2022-06-01 DIAGNOSIS — O99210 Obesity complicating pregnancy, unspecified trimester: Secondary | ICD-10-CM

## 2022-06-01 DIAGNOSIS — O0991 Supervision of high risk pregnancy, unspecified, first trimester: Secondary | ICD-10-CM

## 2022-06-01 DIAGNOSIS — R825 Elevated urine levels of drugs, medicaments and biological substances: Secondary | ICD-10-CM

## 2022-06-01 LAB — URINALYSIS
Bilirubin, UA: NEGATIVE
Glucose, UA: NEGATIVE
Ketones, UA: NEGATIVE
Nitrite, UA: NEGATIVE
RBC, UA: NEGATIVE
Specific Gravity, UA: 1.02 (ref 1.005–1.030)
Urobilinogen, Ur: 0.2 mg/dL (ref 0.2–1.0)
pH, UA: 7 (ref 5.0–7.5)

## 2022-06-01 NOTE — Progress Notes (Signed)
[facility] Health Department Maternal Health Clinic  PRENATAL VISIT NOTE  Subjective:  Cassandra Dixon is a 31 y.o. [phone] at [redacted]w[redacted]d being seen today for ongoing prenatal care.  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  Patient reports no complaints.  Contractions: Not present. Vag. Bleeding: None.  Movement: Present. Denies leaking of fluid/ROM.   The following portions of the patient's history were reviewed and updated as appropriate: allergies, current medications, past family history, past medical history, past social history, past surgical history and problem list. Problem list updated.  Objective:   Vitals:   06/01/22 1042  BP: 98/62  Pulse: 77  Temp: (!) 97 F (36.1 C)  Weight: 193 lb 9.6 oz (87.8 kg)    Fetal Status: Fetal Heart Rate (bpm): 140 Fundal Height: 32 cm Movement: Present     General:  Alert, oriented and cooperative. Patient is in no acute distress.  Skin: Skin is warm and dry. No rash noted.   Cardiovascular: Normal heart rate noted  Respiratory: Normal respiratory effort, no problems with respiration noted  Abdomen: Soft, gravid, appropriate for gestational age.  Pain/Pressure: Absent     Pelvic: Cervical exam deferred        Extremities: Normal range of motion.  Edema: None  Mental Status: Normal mood and affect. Normal behavior. Normal judgment and thought content.   Assessment and Plan:  Pregnancy: [id number] at [redacted]w[redacted]d  1. Supervision of high risk pregnancy in first trimester 23 lb 9.6 oz (10.7 kg) Not working 04/20/22 1 hour glucola=60 Reviewed 05/26/22 u/s at 33 4/7 with AFI wnl, anterior placenta, EFW=30%, 3VC Husband and kids arranging  plane tickets to come from [place] before birth. Pt states she will fly back with them and baby to [place] 2 mo pp - Urinalysis (Urine Dip)  - [id number] Drug Screen  3. Obesity affecting pregnancy, antepartum 23 lb 9.6 oz (10.7 kg) No weight gain in last 2 wks Walking 1x/wk x 15-30 min Not taking ASA 81 mg daily  4. Atypical squamous cells of undetermined significance on cytologic smear of cervix (ASC-US) Needs repeat pap 07/2023  5. Positive urine drug screen +MJ 01/14/22; +MJ 04/05/22 States hasn't smoked x 1 mo; agrees to UDS  States drinking Hibiscus tea is helping with cravings   Preterm labor symptoms and general obstetric precautions including but not limited to vaginal bleeding, contractions, leaking of fluid and fetal movement were reviewed in detail with the patient. Please refer to After Visit Summary for other counseling recommendations.  Return in about 2 weeks (around 06/15/2022) for routine PNC.  No future appointments.  Alberteen Spindle, CNM

## 2022-06-01 NOTE — Progress Notes (Signed)
Cassandra Dixon appt on 05/26/2022. Remains undecided as to post-partum BCM - verified has Birth [employer] previously given. Jossie Ng, RN Urine dip results brought to [facility] by Shawn in [facility] lab as client did not return to [facility]. Urine dip reviewed by E. Sciora CNM and urine culture added to labs (verified with Shawn that available urine for testing). Jossie Ng, RN

## 2022-06-05 LAB — 789231 7+OXYCODONE-BUND
Amphetamines, Urine: NEGATIVE ng/mL
BENZODIAZ UR QL: NEGATIVE ng/mL
Barbiturate screen, urine: NEGATIVE ng/mL
Cocaine (Metab.): NEGATIVE ng/mL
OPIATE SCREEN URINE: NEGATIVE ng/mL
Oxycodone/Oxymorphone, Urine: NEGATIVE ng/mL
PCP Quant, Ur: NEGATIVE ng/mL

## 2022-06-05 LAB — CANNABINOID CONFIRMATION, UR
CANNABINOIDS: POSITIVE — AB
Carboxy THC GC/MS Conf: 257 ng/mL

## 2022-06-07 LAB — URINE CULTURE

## 2022-06-15 ENCOUNTER — Ambulatory Visit: Payer: Medicaid Other | Admitting: Advanced Practice Midwife

## 2022-06-15 VITALS — BP 101/61 | HR 65 | Temp 97.0°F | Wt 194.6 lb

## 2022-06-15 DIAGNOSIS — O99210 Obesity complicating pregnancy, unspecified trimester: Secondary | ICD-10-CM

## 2022-06-15 DIAGNOSIS — B951 Streptococcus, group B, as the cause of diseases classified elsewhere: Secondary | ICD-10-CM

## 2022-06-15 DIAGNOSIS — O0991 Supervision of high risk pregnancy, unspecified, first trimester: Secondary | ICD-10-CM

## 2022-06-15 DIAGNOSIS — O99213 Obesity complicating pregnancy, third trimester: Secondary | ICD-10-CM

## 2022-06-15 DIAGNOSIS — R825 Elevated urine levels of drugs, medicaments and biological substances: Secondary | ICD-10-CM

## 2022-06-15 DIAGNOSIS — R87610 Atypical squamous cells of undetermined significance on cytologic smear of cervix (ASC-US): Secondary | ICD-10-CM

## 2022-06-15 DIAGNOSIS — O0993 Supervision of high risk pregnancy, unspecified, third trimester: Secondary | ICD-10-CM

## 2022-06-15 HISTORY — DX: Streptococcus, group b, as the cause of diseases classified elsewhere: B95.1

## 2022-06-15 NOTE — Progress Notes (Signed)
Declines self-collection of 36 week cultures. Jossie Ng, RN

## 2022-06-15 NOTE — Progress Notes (Signed)
[facility] Health Department Maternal Health Clinic  PRENATAL VISIT NOTE  Subjective:  Cassandra Dixon is a 31 y.o. [phone] at [redacted]w[redacted]d being seen today for ongoing prenatal care.  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  Patient reports no complaints.  Contractions: Not present. Vag. Bleeding: None.  Movement: Present. Denies leaking of fluid/ROM.   The following portions of the patient's history were reviewed and updated as appropriate: allergies, current medications, past family history, past medical history, past social history, past surgical history and problem list. Problem list updated.  Objective:   Vitals:   06/15/22 1108  BP: 101/61  Pulse: 65  Temp: (!) 97 F (36.1 C)  Weight: 194 lb 9.6 oz (88.3 kg)    Fetal Status: Fetal Heart Rate (bpm): 120 Fundal Height: 34 cm Movement: Present  Presentation: Vertex  General:  Alert, oriented and cooperative. Patient is in no acute distress.  Skin: Skin is warm and dry. No rash noted.   Cardiovascular: Normal heart rate noted  Respiratory: Normal respiratory effort, no problems with respiration noted  Abdomen: Soft, gravid, appropriate for gestational age.  Pain/Pressure: Absent     Pelvic: Cervical exam performed Dilation: Fingertip Effacement (%): Thick Station: Ballotable  Extremities: Normal range of motion.  Edema: None  Mental Status: Normal mood and affect. Normal behavior. Normal judgment and thought content.   Assessment and Plan:  Pregnancy: [id number] at [redacted]w[redacted]d  1. Supervision of high risk pregnancy in third trimester GC/Chlamydia/GBS cultures obtained Knows when to go to L&D Husband is  booking plane tickets today to come from [place] U/c's x 1 hour on 06/07/22 and resolved Has car seat and crib - Chlamydia/GC NAA, Confirmation - Culture, beta strep (group b only)  2. Obesity affecting pregnancy, antepartum 24 lb 9.6 oz (11.2 kg) Not taking ASA daily   4. Positive urine drug screen +MJ 01/14/22; +MJ 04/05/22; +UDS MJ 06/01/22 States last use 1 month ago +UDS MJ 06/01/22  5. Atypical squamous cells of undetermined significance on cytologic smear of cervix (ASC-US) Repeat 07/2023  - Chlamydia/GC NAA, Confirmation - Culture, beta strep (group b only)   Preterm labor symptoms and general obstetric precautions including but not limited to vaginal bleeding, contractions, leaking of fluid and fetal movement were reviewed in detail with the patient. Please refer to After Visit Summary for other counseling recommendations.  Return in about 1 week (around 06/22/2022) for routine PNC.  No future appointments.  Alberteen Spindle, CNM

## 2022-06-17 LAB — CHLAMYDIA/GC NAA, CONFIRMATION
Chlamydia trachomatis, NAA: NEGATIVE
Neisseria gonorrhoeae, NAA: NEGATIVE

## 2022-06-18 LAB — CULTURE, BETA STREP (GROUP B ONLY): Strep Gp B Culture: POSITIVE — AB

## 2022-06-23 ENCOUNTER — Ambulatory Visit: Payer: Medicaid Other | Admitting: Physician Assistant

## 2022-06-23 ENCOUNTER — Encounter: Payer: Self-pay | Admitting: Physician Assistant

## 2022-06-23 VITALS — BP 109/72 | HR 73 | Temp 97.4°F | Wt 196.4 lb

## 2022-06-23 DIAGNOSIS — O99320 Drug use complicating pregnancy, unspecified trimester: Secondary | ICD-10-CM

## 2022-06-23 DIAGNOSIS — O99323 Drug use complicating pregnancy, third trimester: Secondary | ICD-10-CM

## 2022-06-23 DIAGNOSIS — O99210 Obesity complicating pregnancy, unspecified trimester: Secondary | ICD-10-CM

## 2022-06-23 DIAGNOSIS — O0990 Supervision of high risk pregnancy, unspecified, unspecified trimester: Secondary | ICD-10-CM

## 2022-06-23 DIAGNOSIS — F1290 Cannabis use, unspecified, uncomplicated: Secondary | ICD-10-CM

## 2022-06-23 DIAGNOSIS — O99213 Obesity complicating pregnancy, third trimester: Secondary | ICD-10-CM

## 2022-06-23 DIAGNOSIS — O0993 Supervision of high risk pregnancy, unspecified, third trimester: Secondary | ICD-10-CM

## 2022-06-23 NOTE — Progress Notes (Signed)
[facility] Health Department Maternal Health Clinic  PRENATAL VISIT NOTE  Subjective:  Cassandra Dixon is a 31 y.o. [phone] at [redacted]w[redacted]d being seen today for ongoing prenatal care.  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  Patient reports  had some low pelvic cramping yesterday, but this is resolved . Also occ has uncomfortable foot swelling, L>R.  Contractions: Not present. Vag. Bleeding: None.  Movement: Present. Denies leaking of fluid/ROM.   The following portions of the patient's history were reviewed and updated as appropriate: allergies, current medications, past family history, past medical history, past social history, past surgical history and problem list. Problem list updated.  Objective:   Vitals:   06/23/22 1355  BP: 109/72  Pulse: 73  Temp: (!) 97.4 F (36.3 C)  Weight: 196 lb 6.4 oz (89.1 kg)    Fetal Status: Fetal Heart Rate (bpm): 132 Fundal Height: 36 cm Movement: Present  Presentation: Vertex  General:  Alert, oriented and cooperative. Patient is in no acute distress.  Skin: Skin is warm and dry. No rash noted.   Cardiovascular: Normal heart rate noted  Respiratory: Normal respiratory effort, no problems with respiration noted  Abdomen: Soft, gravid, appropriate for gestational age.  Pain/Pressure: Absent     Pelvic: Cervical exam deferred        Extremities: Normal range of motion.  Edema: None  Mental Status: Normal mood and affect. Normal behavior. Normal judgment and thought content.   Assessment and Plan:  Pregnancy: [id number] at [redacted]w[redacted]d  1. Supervision of high risk pregnancy, antepartum Encouraged po liquids.  Elevate feet prn swelling. Counseled now at term.  2. Obesity affecting pregnancy, antepartum Not taking aspirin. TWG 26 lb.  3. 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   Preterm labor symptoms and general obstetric precautions including but not limited to vaginal bleeding, contractions, leaking of fluid and fetal movement were reviewed in detail with the patient. Please refer to After Visit Summary for other counseling recommendations.  Return in about 1 week (around 06/30/2022) for Routine prenatal care.  Future Appointments  Date Time Provider Department Center  07/01/2022  8:40 AM [facility] PROVIDER AC[facility] None  07/07/2022  2:00 PM [facility] PROVIDER AC[facility] None    Landry Dyke, PA-C

## 2022-06-24 ENCOUNTER — Encounter: Payer: Self-pay | Admitting: Family Medicine

## 2022-06-24 NOTE — Progress Notes (Unsigned)
Patient ID: Cassandra Dixon, female   DOB: Sep 02, 1991, 31 y.o.   MRN: [id number] [facility] Department Maternity Care Conference  Maternity Care Conference Date: 06/24/22  [name] was identified by clinical staff to benefit from an interdisciplinary team approach to help improve pregnancy care.  The [facility] Maternity Care Conference includes the maternity clinic coordinator (RN), medical providers (MD/APP staff), Care Management -OBCM and Healthy Beginnings, Centering Pregnancy coordinator, Infant Mortality reduction Dietitian.  Nursing staff are also encouraged to participate. The group meets monthly to discuss patient care and coordinate services.   The patient's care care at the agency was reviewed in EMR and high risk factors evaluated in an interdisciplinary approach.    Value added interventions discussed at this care conference today were:   [place] Meeting 06/16/22 Patient has been attending appointments regularly  [name]

## 2022-07-01 ENCOUNTER — Ambulatory Visit: Payer: Medicaid Other

## 2022-07-01 ENCOUNTER — Ambulatory Visit: Payer: Medicaid Other | Admitting: Advanced Practice Midwife

## 2022-07-01 VITALS — BP 107/70 | HR 79 | Temp 98.0°F | Wt 198.4 lb

## 2022-07-01 DIAGNOSIS — O0990 Supervision of high risk pregnancy, unspecified, unspecified trimester: Secondary | ICD-10-CM

## 2022-07-01 DIAGNOSIS — O99213 Obesity complicating pregnancy, third trimester: Secondary | ICD-10-CM

## 2022-07-01 DIAGNOSIS — O99210 Obesity complicating pregnancy, unspecified trimester: Secondary | ICD-10-CM

## 2022-07-01 DIAGNOSIS — O0993 Supervision of high risk pregnancy, unspecified, third trimester: Secondary | ICD-10-CM

## 2022-07-01 DIAGNOSIS — R825 Elevated urine levels of drugs, medicaments and biological substances: Secondary | ICD-10-CM

## 2022-07-01 MED ORDER — PRENATAL VITAMINS 28-0.8 MG PO TABS: 1.0000 | ORAL_TABLET | Freq: Every day | ORAL | 0 refills | Status: DC

## 2022-07-01 NOTE — Progress Notes (Signed)
[facility] Health Department Maternal Health Clinic  PRENATAL VISIT NOTE  Subjective:  Cassandra Dixon is a 31 y.o. [phone] at [redacted]w[redacted]d being seen today for ongoing prenatal care.  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  Patient reports no complaints.  Contractions: Not present. Vag. Bleeding: None.  Movement: Present. Denies leaking of fluid/ROM.   The following portions of the patient's history were reviewed and updated as appropriate: allergies, current medications, past family history, past medical history, past social history, past surgical history and problem list. Problem list updated.  Objective:   Vitals:   07/01/22 1446  BP: 107/70  Pulse: 79  Temp: 98 F (36.7 C)  Weight: 198 lb 6.4 oz (90 kg)    Fetal Status: Fetal Heart Rate (bpm): 136 Fundal Height: 37 cm Movement: Present  Presentation: Vertex  General:  Alert, oriented and cooperative. Patient is in no acute distress.  Skin: Skin is warm and dry. No rash noted.   Cardiovascular: Normal heart rate noted  Respiratory: Normal respiratory effort, no problems with respiration noted  Abdomen: Soft, gravid, appropriate for gestational age.  Pain/Pressure: Absent     Pelvic: Cervical exam deferred        Extremities: Normal range of motion.  Edema: None  Mental Status: Normal mood and affect. Normal behavior. Normal judgment and thought content.   Assessment and Plan:  Pregnancy: [id number] at [redacted]w[redacted]d  1. Supervision of high risk pregnancy, antepartum Ready for baby at home Knows when to go to L&D Husband and kids coming from [place] 07/05/22 Pt desires IOL 07/24/22--paperwork  completed +GBS Has crib and car seat and ready for baby at home Walking stairs and in garage daily x 1 hour - Prenatal Vit-Fe Fumarate-FA (PRENATAL VITAMINS) 28-0.8 MG TABS; Take 1 tablet by mouth daily.  Dispense: 100 tablet; Refill: 0  2. Obesity affecting pregnancy, antepartum 28 lb 6.4 oz (12.9 kg) 2 lb wt gain in past week  3. Positive urine drug screen +MJ 01/14/22; +MJ 04/05/22; +UDS MJ 06/01/22 States last use June   Term labor symptoms and general obstetric precautions including but not limited to vaginal bleeding, contractions, leaking of fluid and fetal movement were reviewed in detail with the patient. Please refer to After Visit Summary for other counseling recommendations.  No follow-ups on file.  Future Appointments  Date Time Provider Department Center  07/07/2022  2:00 PM [facility] PROVIDER AC-MAT None    Alberteen Spindle, CNM

## 2022-07-01 NOTE — Progress Notes (Signed)
IOL referral faxed to [facility] with snapshot pages / today's clinic note, overview box, initial [facility] appt notes and [place] reports x2. Fax confirmation received. Jossie Ng, RN

## 2022-07-01 NOTE — Progress Notes (Signed)
The patient was dispensed a bottle of Prenatal Vitamins today. I provided counseling today regarding the medication. We discussed the medication, the side effects and when to call clinic. Patient given the opportunity to ask questions. Questions answered. Patient remains undecided regarding method of birth control postpartum. Shiela Mayer RN

## 2022-07-07 ENCOUNTER — Ambulatory Visit: Payer: Medicaid Other | Admitting: Nurse Practitioner

## 2022-07-07 ENCOUNTER — Telehealth: Payer: Self-pay

## 2022-07-07 ENCOUNTER — Encounter: Payer: Self-pay | Admitting: Nurse Practitioner

## 2022-07-07 VITALS — BP 103/69 | HR 66 | Temp 66.0°F | Wt 194.0 lb

## 2022-07-07 DIAGNOSIS — O0990 Supervision of high risk pregnancy, unspecified, unspecified trimester: Secondary | ICD-10-CM

## 2022-07-07 DIAGNOSIS — O99210 Obesity complicating pregnancy, unspecified trimester: Secondary | ICD-10-CM

## 2022-07-07 DIAGNOSIS — O99320 Drug use complicating pregnancy, unspecified trimester: Secondary | ICD-10-CM

## 2022-07-07 DIAGNOSIS — F1290 Cannabis use, unspecified, uncomplicated: Secondary | ICD-10-CM

## 2022-07-07 DIAGNOSIS — O0993 Supervision of high risk pregnancy, unspecified, third trimester: Secondary | ICD-10-CM

## 2022-07-07 DIAGNOSIS — O99213 Obesity complicating pregnancy, third trimester: Secondary | ICD-10-CM

## 2022-07-07 DIAGNOSIS — O99323 Drug use complicating pregnancy, third trimester: Secondary | ICD-10-CM

## 2022-07-07 NOTE — Progress Notes (Addendum)
[facility] Health Department Maternal Health Clinic  PRENATAL VISIT NOTE  Subjective:  Cassandra Dixon is a 31 y.o. [phone] at [redacted]w[redacted]d being seen today for ongoing prenatal care.  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  Patient reports no complaints.  Contractions: Not present. Vag. Bleeding: None.  Movement: Present. Denies leaking of fluid/ROM.   The following portions of the patient's history were reviewed and updated as appropriate: allergies, current medications, past family history, past medical history, past social history, past surgical history and problem list. Problem list updated.  Objective:   Vitals:   07/07/22 1411  BP: 103/69  Pulse: 66  Temp: (!) 66 F (18.9 C)  Weight: 194 lb (88 kg)    Fetal Status: Fetal Heart Rate (bpm): 140 Fundal Height: 38 cm Movement: Present  Presentation: Vertex  General:  Alert, oriented and cooperative. Patient is in no acute distress.  Skin: Skin is warm and dry. No rash noted.   Cardiovascular: Normal heart rate noted  Respiratory: Normal respiratory effort, no problems with respiration noted  Abdomen: Soft, gravid, appropriate for gestational age.  Pain/Pressure: Absent     Pelvic: Cervical exam performed Dilation: 1 Effacement (%): Thick Station: Ballotable  Extremities: Normal range of motion.  Edema: None  Mental Status: Normal mood and affect. Normal behavior. Normal judgment and thought content.   Assessment and Plan:  Pregnancy: [id number] at [redacted]w[redacted]d  1. Supervision of high risk pregnancy, antepartum -31 year old female in clinic for prenatal care.   -ROS reviewed, no complaints  noted.  -Taking PNV daily. -IOL paperwork completed on 07/18/22. -Encouraged patient to increase walking, nipple stimulation, and even sexual intercourse to initiate labor.    2. 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  3. Obesity affecting pregnancy, antepartum -Patient states she exercises daily between 30-60 minutes.  -24 lb (10.9 kg)    Term labor symptoms and general obstetric precautions including but not limited to vaginal bleeding, contractions, leaking of fluid and fetal movement were reviewed in detail with the patient. Please refer to After Visit Summary for other counseling recommendations.   Return in about 1 week (around 07/14/2022) for Routine prenatal care visit.  Future Appointments  Date Time Provider Department Center  07/14/2022  1:40 PM [facility] PROVIDER AC-[facility] None  07/21/2022  1:20 PM [facility] PROVIDER AC-MAT None   Due to a language barrier an interpreter Cassandra Dixon) was used for the provider portion of the visit.     Glenna Fellows, FNP

## 2022-07-07 NOTE — Progress Notes (Signed)
Remains undecided as to birth control method post-partum (has [employer] fast streamer). Counseled no intercourse for 6 weeks post-partum until birth control established at post-partum appt. [facility] IOL appt pending. Jossie Ng, RN

## 2022-07-07 NOTE — Telephone Encounter (Signed)
Call to [facility] to ascertain if IOL appt scheduled as referral faxed 07/01/2022. Per Lawanna Kobus, via Donato Schultz in L & D, referral for IOL not needed until 1 week prior to desired IOL. As this RN has never known about this referral time, referral and records faxed again with confirmation received. Jossie Ng, RN

## 2022-07-07 NOTE — Telephone Encounter (Signed)
Received fax from Elma at [facility] stating referral for IOL to be faxed one week prior to requested IOL date of 07/24/2022 (needs to be faxed on 07/18/2022). Referral and faxed message in outguide on [facility] nurse to do cart. Jossie Ng, RN

## 2022-07-12 ENCOUNTER — Other Ambulatory Visit: Payer: Self-pay

## 2022-07-12 ENCOUNTER — Encounter: Payer: Self-pay | Admitting: Obstetrics and Gynecology

## 2022-07-12 ENCOUNTER — Inpatient Hospital Stay
Admission: EM | Admit: 2022-07-12 | Discharge: 2022-07-14 | DRG: 807 | Disposition: A | Discharge status: Home / Self Care | Payer: Medicaid Other | Attending: Obstetrics | Admitting: Obstetrics

## 2022-07-12 DIAGNOSIS — O99214 Obesity complicating childbirth: Secondary | ICD-10-CM | POA: Diagnosis present

## 2022-07-12 DIAGNOSIS — O99320 Drug use complicating pregnancy, unspecified trimester: Secondary | ICD-10-CM | POA: Diagnosis present

## 2022-07-12 DIAGNOSIS — B951 Streptococcus, group B, as the cause of diseases classified elsewhere: Secondary | ICD-10-CM | POA: Diagnosis present

## 2022-07-12 DIAGNOSIS — F1290 Cannabis use, unspecified, uncomplicated: Secondary | ICD-10-CM | POA: Diagnosis present

## 2022-07-12 DIAGNOSIS — Z3A40 40 weeks gestation of pregnancy: Secondary | ICD-10-CM

## 2022-07-12 DIAGNOSIS — O99824 Streptococcus B carrier state complicating childbirth: Secondary | ICD-10-CM | POA: Diagnosis present

## 2022-07-12 DIAGNOSIS — O99210 Obesity complicating pregnancy, unspecified trimester: Secondary | ICD-10-CM | POA: Diagnosis present

## 2022-07-12 DIAGNOSIS — O480 Post-term pregnancy: Principal | ICD-10-CM | POA: Diagnosis present

## 2022-07-12 DIAGNOSIS — Z87891 Personal history of nicotine dependence: Secondary | ICD-10-CM

## 2022-07-12 DIAGNOSIS — O0993 Supervision of high risk pregnancy, unspecified, third trimester: Secondary | ICD-10-CM | POA: Diagnosis not present

## 2022-07-12 DIAGNOSIS — O0990 Supervision of high risk pregnancy, unspecified, unspecified trimester: Secondary | ICD-10-CM

## 2022-07-12 LAB — CBC
HCT: 40 % (ref 36.0–46.0)
Hemoglobin: 13.4 g/dL (ref 12.0–15.0)
MCH: 29.2 pg (ref 26.0–34.0)
MCHC: 33.5 g/dL (ref 30.0–36.0)
MCV: 87.1 fL (ref 80.0–100.0)
Platelets: 244 10*3/uL (ref 150–400)
RBC: 4.59 MIL/uL (ref 3.87–5.11)
RDW: 13.8 % (ref 11.5–15.5)
WBC: 12.4 10*3/uL — ABNORMAL HIGH (ref 4.0–10.5)
nRBC: 0 % (ref 0.0–0.2)

## 2022-07-12 MED ORDER — PRENATAL MULTIVITAMIN CH
1.0000 | ORAL_TABLET | Freq: Every day | ORAL | Status: DC
  Administered 2022-07-12 – 2022-07-14 (×3): 1 via ORAL
  Filled 2022-07-12 (×3): qty 1

## 2022-07-12 MED ORDER — WITCH HAZEL-GLYCERIN EX PADS: 1.0000 | MEDICATED_PAD | CUTANEOUS | Status: DC | PRN

## 2022-07-12 MED ORDER — LIDOCAINE HCL (PF) 1 % IJ SOLN
30.0000 mL | INTRAMUSCULAR | Status: DC | PRN
  Filled 2022-07-12: qty 30

## 2022-07-12 MED ORDER — ONDANSETRON HCL 4 MG/2ML IJ SOLN: 4.0000 mg | Freq: Four times a day (QID) | INTRAMUSCULAR | Status: DC | PRN

## 2022-07-12 MED ORDER — SIMETHICONE 80 MG PO CHEW: 80.0000 mg | CHEWABLE_TABLET | ORAL | Status: DC | PRN

## 2022-07-12 MED ORDER — LACTATED RINGERS IV SOLN: INTRAVENOUS | Status: DC

## 2022-07-12 MED ORDER — OXYTOCIN BOLUS FROM INFUSION
333.0000 mL | Freq: Once | INTRAVENOUS | Status: AC
  Administered 2022-07-12: 333 mL via INTRAVENOUS

## 2022-07-12 MED ORDER — COCONUT OIL OIL: 1.0000 | TOPICAL_OIL | Status: DC | PRN

## 2022-07-12 MED ORDER — SODIUM CHLORIDE 0.9 % IV SOLN
2.0000 g | Freq: Once | INTRAVENOUS | Status: AC
  Administered 2022-07-12: 2 g via INTRAVENOUS
  Filled 2022-07-12: qty 2000

## 2022-07-12 MED ORDER — OXYTOCIN-SODIUM CHLORIDE 30-0.9 UT/500ML-% IV SOLN
2.5000 [IU]/h | INTRAVENOUS | Status: DC
  Filled 2022-07-12: qty 500

## 2022-07-12 MED ORDER — FERROUS SULFATE 325 (65 FE) MG PO TABS
325.0000 mg | ORAL_TABLET | Freq: Two times a day (BID) | ORAL | Status: DC
  Administered 2022-07-12 – 2022-07-14 (×4): 325 mg via ORAL
  Filled 2022-07-12 (×5): qty 1

## 2022-07-12 MED ORDER — SENNOSIDES-DOCUSATE SODIUM 8.6-50 MG PO TABS
2.0000 | ORAL_TABLET | ORAL | Status: DC
  Administered 2022-07-12: 2 via ORAL
  Filled 2022-07-12: qty 2

## 2022-07-12 MED ORDER — ACETAMINOPHEN 325 MG PO TABS: 650.0000 mg | ORAL_TABLET | ORAL | Status: DC | PRN

## 2022-07-12 MED ORDER — DIPHENHYDRAMINE HCL 25 MG PO CAPS: 25.0000 mg | ORAL_CAPSULE | Freq: Four times a day (QID) | ORAL | Status: DC | PRN

## 2022-07-12 MED ORDER — BENZOCAINE-MENTHOL 20-0.5 % EX AERO: 1.0000 | INHALATION_SPRAY | CUTANEOUS | Status: DC | PRN

## 2022-07-12 MED ORDER — ONDANSETRON HCL 4 MG/2ML IJ SOLN: 4.0000 mg | INTRAMUSCULAR | Status: DC | PRN

## 2022-07-12 MED ORDER — SODIUM CHLORIDE 0.9 % IV SOLN
1.0000 g | INTRAVENOUS | Status: DC
  Filled 2022-07-12 (×2): qty 1000

## 2022-07-12 MED ORDER — DIBUCAINE (PERIANAL) 1 % EX OINT: 1.0000 | TOPICAL_OINTMENT | CUTANEOUS | Status: DC | PRN

## 2022-07-12 MED ORDER — SOD CITRATE-CITRIC ACID 500-334 MG/5ML PO SOLN: 30.0000 mL | ORAL | Status: DC | PRN

## 2022-07-12 MED ORDER — LACTATED RINGERS IV SOLN: 500.0000 mL | INTRAVENOUS | Status: DC | PRN

## 2022-07-12 MED ORDER — IBUPROFEN 600 MG PO TABS
600.0000 mg | ORAL_TABLET | Freq: Four times a day (QID) | ORAL | Status: DC
  Administered 2022-07-12 – 2022-07-14 (×7): 600 mg via ORAL
  Filled 2022-07-12 (×8): qty 1

## 2022-07-12 MED ORDER — ONDANSETRON HCL 4 MG PO TABS: 4.0000 mg | ORAL_TABLET | ORAL | Status: DC | PRN

## 2022-07-12 NOTE — OB Triage Note (Addendum)
Patient is a G5P3 at [redacted]w[redacted]d who presents c/o ctx since last night, urge to push. Caginal exam 7/80/-1. Chari Manning CNM notified of arrival.

## 2022-07-12 NOTE — Discharge Summary (Signed)
Obstetrical Discharge Summary  Patient Name: Cassandra Dixon DOB: Jun 16, 1991 MRN: [id number]  Date of Admission: 07/12/2022 Date of Delivery: 07/12/22 Delivered by: Dr. Thomasene Mohair Date of Discharge: 07/14/22  Primary OB: [facility] CWC:[facility]D last menstrual period was 10/12/2021 (within weeks). EDC Estimated Date of Delivery: 07/10/22 Gestational Age at Delivery: [redacted]w[redacted]d   Antepartum complications:  Abnormal Pap smear ASCUS HPV neg Marijuana use 4-5x day Obesity GBS Positive  Admitting Diagnosis: Normal labor and delivery [O80]  Secondary Diagnosis: Patient Active Problem List   Diagnosis Date Noted   Normal labor and delivery 07/12/2022   [redacted] weeks gestation of pregnancy 07/12/2022   Group beta Strep positive 06/15/2022   Positive urine drug screen +MJ 01/14/22; +MJ 04/05/22; +UDS MJ 06/01/22 01/24/2022   Supervision of high risk pregnancy, antepartum 01/14/2022   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 01/14/2022   Obesity affecting pregnancy BMI=33.6 01/14/2022   Abnormal Pap smear of cervix 07/28/20 ASCUS HPV neg 08/06/2020    Discharge Diagnosis: Term Pregnancy Delivered      Augmentation: N/A Complications: None Intrapartum complications/course: Precipitous delivery Delivery Type: spontaneous vaginal delivery Anesthesia: non-pharmacological methods Placenta: spontaneous To Pathology: No  Laceration: n/a Episiotomy: none Newborn Data: Live born child  Birth Weight: 7 lb 6.2 oz (3350 g) APGAR: 8, 9  Newborn Delivery   Birth date/time: 07/12/2022 09:40:00 Delivery type: Vaginal, Spontaneous      Postpartum Procedures: none Edinburgh:     07/13/2022    4:59 PM  Edinburgh Postnatal Depression Scale Screening Tool  I have been able to laugh and see the funny side of things. 0  I have looked forward with enjoyment to things. 0  I have blamed myself unnecessarily when things went wrong. 0  I have been anxious or worried for no good reason. 0  I have felt scared or  panicky for no good reason. 0  Things have been getting on top of me. 0  I have been so unhappy that I have had difficulty sleeping. 0  I have felt sad or miserable. 0  I have been so unhappy that I have been crying. 0  The thought of harming myself has occurred to me. 0  Edinburgh Postnatal Depression Scale Total 0     Post partum course:  Patient had an uncomplicated postpartum course.  By time of discharge on PPD#2, her pain was controlled on oral pain medications; she had appropriate lochia and was ambulating, voiding without difficulty and tolerating regular diet.  She was deemed stable for discharge to home.     Discharge Physical Exam:  BP 109/69 (BP Location: Left Arm)   Pulse 62   Temp 97.9 F (36.6 C) (Oral)   Resp 18   Ht 5\' 1"  (1.549 m)   Wt 88 kg   LMP 10/12/2021 (Within Weeks)   SpO2 99%   Breastfeeding Unknown   BMI 36.66 kg/m   General: NAD CV: RRR Pulm: CTABL, nl effort ABD: s/nd/nt, fundus firm and below the umbilicus Lochia: moderate Perineum:minimal edema/intact   Hemoglobin  Date Value Ref Range Status  07/13/2022 12.4 12.0 - 15.0 g/dL Final  09/12/2022 17/61/6073 11.1 - 15.9 g/dL Final   HCT  Date Value Ref Range Status  07/13/2022 37.1 36.0 - 46.0 % Final   Hematocrit  Date Value Ref Range Status  01/14/2022 38.9 34.0 - 46.6 % Final    Risk assessment for postpartum VTE and prophylactic treatment: Very high risk factors: None High risk factors: None Moderate risk factors: BMI 30-40  kg/m2  Postpartum VTE prophylaxis with LMWH not indicated  Disposition: stable, discharge to home. Baby Feeding: breast feeding Baby Disposition: home with mom  Rh Immune globulin indicated: No Rubella vaccine given: was not indicated Varivax vaccine given: was not indicated Flu vaccine given in AP setting: No Tdap vaccine given in AP setting: Yes   Contraception:  Undecided  Prenatal Labs:  Blood type/Rh O positive  Antibody screen negative  Rubella  Immune  Varicella Immune    RPR NR  HBsAg negative  HIV negative  GC negative  Chlamydia negative  Genetic screening unknown  1 hour GTT 92  3 hour GTT N/a  GBS positive      Plan:  Cassandra Dixon was discharged to home in good condition. Follow-up appointment with delivering provider in 6 weeks.  Discharge Medications: Allergies as of 07/14/2022       Reactions   Shrimp [shellfish Allergy] Shortness Of Breath, Swelling        Medication List     STOP taking these medications    Prenatal Vitamins 28-0.8 MG Tabs       TAKE these medications    acetaminophen 325 MG tablet Commonly known as: Tylenol Take 2 tablets (650 mg total) by mouth every 4 (four) hours as needed (for pain scale < 4).   benzocaine-Menthol 20-0.5 % Aero Commonly known as: DERMOPLAST Apply 1 Application topically as needed for irritation (perineal discomfort).   coconut oil Oil Apply 1 Application topically as needed.   dibucaine 1 % Oint Commonly known as: NUPERCAINAL Place 1 Application rectally as needed for hemorrhoids.   ferrous sulfate 325 (65 FE) MG tablet Take 1 tablet (325 mg total) by mouth 2 (two) times daily with a meal.   ibuprofen 600 MG tablet Commonly known as: ADVIL Take 1 tablet (600 mg total) by mouth every 6 (six) hours.   prenatal multivitamin Tabs tablet Take 1 tablet by mouth daily at 12 noon.   senna-docusate 8.6-50 MG tablet Commonly known as: Senokot-S Take 2 tablets by mouth daily.   simethicone 80 MG chewable tablet Commonly known as: MYLICON Chew 1 tablet (80 mg total) by mouth as needed for flatulence.   witch hazel-glycerin pad Commonly known as: TUCKS Apply 1 Application topically as needed for hemorrhoids.         Follow-up Information     [facility] DEPT Follow up in 6 week(s).   Why: pp visit Contact information: [address] [place] [address] [phone]                 Signed: Chari Manning CNM

## 2022-07-12 NOTE — H&P (Signed)
OB History & Physical   History of Present Illness:  Chief Complaint: contractions  HPI:  Cassandra Dixon is a 31 y.o. [phone] female at [redacted]w[redacted]d dated by 29 week ultrasound.  Her pregnancy has been complicated by marijuana use .    She reports contractions.     Total weight gain for pregnancy: 10.9 kg   Obstetrical Problem List: pregnancy 2023 Problems (from 01/13/22 to present)     Problem Noted Resolved   Group beta Strep positive 06/15/2022 by Hart Carwin, RN No   Overview Signed 06/20/2022  8:45 AM by Hart Carwin, RN    Counsel patient at her 06/23/22 1:40 appointment.       Supervision of high risk pregnancy, antepartum 01/14/2022 by Alberteen Spindle, CNM No   Overview Addendum 07/01/2022  2:49 PM by Pearletha Furl, RN     Nursing Staff Provider  Office Location  [facility] Dating  On 04/26/22 @ 29 2/7=07/10/22  Language  English Anatomy US  On 05/26/22=  Flu Vaccine  Declined 01/18/22 Genetic Screen  NIPS:   AFP:   First Screen:  Quad:    TDaP vaccine   04/20/2022 Hgb A1C or  GTT Early = 92             (01/14/22) Third trimester   COVID vaccine     Rhogam     LAB RESULTS   Feeding Plan Breast  Blood Type   O positive            (01/14/22)  Contraception Still unsure, 05/18/22 resource given, unsure 07/28  Antibody   negative              (01/14/22)  Circumcision  Rubella   immune               (12/20/13)  Pediatrician  [facility]   non-reactive        (01/14/22)  Support Person  HBsAg   negative              (01/14/22)  Prenatal Classes  HIV  non-reactive         (01/14/22)  @28wk - Doula referral?  Varicella  Immune                 (12/20/13)    HCV   negative                (01/14/22)  BTL Consent  GBS  (For PCN allergy, check sensitivities) Positive (06/15/22) Shrimp/Shellfish allergy only (717/23)     QFT   negative                 (01/14/22)  VBAC Consent  Pap  07/28/20=  ASCUS HPV neg    Hgb Electro   normal                    (12/20/13)  BP Cuff ordered  CF   Delivery Group  [facility]   Centering Group  OBCM involved               Maternal Medical History:   Past Medical History:  Diagnosis Date   Chlamydia infection affecting pregnancy 2017   Precipitous delivery 07/17/2017   01/13/22 states all deliveries were fast    Past Surgical History:  Procedure Laterality Date        WISDOM TOOTH EXTRACTION N/A    states all 4 wisdom teeth pulled when she was around 31 years old  Allergies  Allergen Reactions   Shrimp [Shellfish Allergy] Shortness Of Breath and Swelling    Prior to Admission medications   Medication Sig Start Date End Date Taking? Authorizing Provider  Prenatal Vit-Fe Fumarate-FA (PRENATAL MULTIVITAMIN) TABS tablet Take 1 tablet by mouth daily at 12 noon. 01/18/22   Sciora, Austin Miles, CNM  Prenatal Vit-Fe Fumarate-FA (PRENATAL VITAMINS) 28-0.8 MG TABS Take 1 tablet by mouth daily. 07/01/22   Federico Flake, MD    OB History  Gravida Para Term Preterm AB Living  5 3 3  0 1 3  SAB IAB Ectopic Multiple Live Births  0 0 0 0 3    # Outcome Date GA Lbr Len/2nd Weight Sex Delivery Anes PTL Lv  5 Current           4 AB 04/03/21 [redacted]w[redacted]d         3 Term 07/17/17 [redacted]w[redacted]d 00:10 3320 g M Vag-Spont None  LIV  2 Term 09/11/16 [redacted]w[redacted]d 04:40 3260 g F Vag-Spont None  LIV  1 Term 02/27/14 [redacted]w[redacted]d  3118 g M Vag-Spont   LIV    Obstetric Comments  01-13-22 States she delivered all her babies quickly. Says in labor 1 hour at home and then about 20 minutes at the hospital and delivered.  01-15-1972 RN    Prenatal care site: [facility]  Social History: She  reports that she has quit smoking. She has been exposed to tobacco smoke. She has never used smokeless tobacco. She reports that she does not currently use alcohol. She reports that she does not currently use drugs after having used the following drugs: Marijuana.  Family History: family history includes Diabetes in her father; Healthy in her brother, brother, daughter, maternal grandfather, maternal  grandmother, mother, paternal grandfather, paternal grandmother, son, and son; Hypertension in her father.   Review of Systems:  Review of Systems  Gastrointestinal:  Positive for abdominal pain (contractions).     Physical Exam:  LMP 10/12/2021 (Within Weeks)   Physical Exam Genitourinary:     Genitourinary Comments: Complete and pushing      Pertinent Results:  Prenatal Labs Blood type/Rh O positive  Antibody screen negative  Rubella Immune  Varicella Immune    RPR NR  HBsAg negative  HIV negative  GC negative  Chlamydia negative  Genetic screening unknown  1 hour GTT 92  3 hour GTT N/a  GBS positive    Cephalic by exam   Assessment:  Cassandra Dixon is a 31 y.o. [phone] female at [redacted]w[redacted]d with advanced labor.  Delivered at the time of this note.   Plan:  Admit to Labor & Delivery  CBC, T&S, Clrs, IVF GBS positive.  Received 1 dose of PCN Fetwal well-being: viable female infant born.    [redacted]w[redacted]d, MD 07/12/2022 9:53 AM

## 2022-07-13 LAB — CBC
HCT: 37.1 % (ref 36.0–46.0)
Hemoglobin: 12.4 g/dL (ref 12.0–15.0)
MCH: 29.3 pg (ref 26.0–34.0)
MCHC: 33.4 g/dL (ref 30.0–36.0)
MCV: 87.7 fL (ref 80.0–100.0)
Platelets: 238 10*3/uL (ref 150–400)
RBC: 4.23 MIL/uL (ref 3.87–5.11)
RDW: 14.1 % (ref 11.5–15.5)
WBC: 8.2 10*3/uL (ref 4.0–10.5)
nRBC: 0 % (ref 0.0–0.2)

## 2022-07-13 LAB — RPR: RPR Ser Ql: NONREACTIVE

## 2022-07-13 NOTE — Lactation Note (Signed)
This note was copied from a baby's chart. Lactation Consultation Note  Patient Name: Cassandra Dixon [id number] Date: 07/13/2022 Reason for consult: Initial assessment Age:31 hours  Maternal Data  This is mom's 4th baby. NSVD. Mom is an experienced breastfeeding mother. Today mom reports baby is latching and breastfeeding well.   Does the patient have breastfeeding experience prior to this delivery?: Yes How long did the patient breastfeed?: Mom reports she breastfed her other 3 children for >than 1 year.  Feeding Mother's Current Feeding Choice: Breast Milk Observed baby latched and positioned well at the breast. Baby with audible swallows. Mom independently and confidently breastfeeding.   LATCH Score Latch: Grasps breast easily, tongue down, lips flanged, rhythmical sucking.  Audible Swallowing: Spontaneous and intermittent  Type of Nipple: Everted at rest and after stimulation  Comfort (Breast/Nipple): Soft / non-tender  Hold (Positioning): No assistance needed to correctly position infant at breast.  LATCH Score: 10  Interventions Interventions: Breast feeding basics reviewed;Hand pump;Education  Discharge Discharge Education: Engorgement and breast care;Warning signs for feeding baby;Other (comment) (Mom will bring baby for follow-up care at [facility] clinic in [place]. Provided mom with LC contact info if she has any additional lactations questions or needs after she goes home.) Pump: Manual (Provided mom with harmony manual pump. Mom has used thgis pump in the past.)  Consult Status Consult Status: Complete  Update provided to care nurse.  Fuller Song 07/13/2022, 10:20 AM

## 2022-07-13 NOTE — Progress Notes (Signed)
Postpartum Day  1  Subjective: no complaints, up ad lib, voiding, and tolerating PO  Doing well, no concerns. Ambulating without difficulty, pain managed with PO meds, tolerating regular diet, and voiding without difficulty.   No fever/chills, chest pain, shortness of breath, nausea/vomiting, or leg pain. No nipple or breast pain. No headache, visual changes, or RUQ/epigastric pain.  Objective: BP 97/64 (BP Location: Right Arm)   Pulse (!) 50   Temp 97.7 F (36.5 C) (Oral)   Resp 18   Ht 5\' 1"  (1.549 m)   Wt 88 kg   LMP 10/12/2021 (Within Weeks)   SpO2 99%   Breastfeeding Unknown   BMI 36.66 kg/m    Physical Exam:  General: alert, cooperative, and no distress Breasts: soft/nontender CV: RRR Pulm: nl effort, CTABL Abdomen: soft, non-tender, active bowel sounds Uterine Fundus: firm Perineum:  minimal edema, intact Lochia: appropriate DVT Evaluation: No evidence of DVT seen on physical exam.  Recent Labs    07/12/22 1021 07/13/22 0550  HGB 13.4 12.4  HCT 40.0 37.1  WBC 12.4* 8.2  PLT 244 238    Assessment/Plan: 31 y.o. 09/12/22 postpartum day # 1  -Continue routine postpartum care -Lactation consult PRN for breastfeeding  -CBC reviewed - hemodynamically stable and asymptomatic -Immunization status:   all immunizations up to date -GBS positive - treated with 1 dose of ampicillin d/t precipitous delivery.     Disposition: Continue inpatient postpartum care    LOS: 1 day   [id number], CNM 07/13/2022, 9:05 AM   ----- 09/12/2022  Certified Nurse Midwife [facility]

## 2022-07-14 ENCOUNTER — Ambulatory Visit: Payer: Medicaid Other

## 2022-07-14 MED ORDER — SIMETHICONE 80 MG PO CHEW: 80.0000 mg | CHEWABLE_TABLET | ORAL | 0 refills | Status: DC | PRN

## 2022-07-14 MED ORDER — ACETAMINOPHEN 325 MG PO TABS: 650.0000 mg | ORAL_TABLET | ORAL | Status: DC | PRN

## 2022-07-14 MED ORDER — IBUPROFEN 600 MG PO TABS: 600.0000 mg | ORAL_TABLET | Freq: Four times a day (QID) | ORAL | 0 refills | Status: DC

## 2022-07-14 MED ORDER — WITCH HAZEL-GLYCERIN EX PADS: 1.0000 | MEDICATED_PAD | CUTANEOUS | 12 refills | Status: DC | PRN

## 2022-07-14 MED ORDER — FERROUS SULFATE 325 (65 FE) MG PO TABS: 325.0000 mg | ORAL_TABLET | Freq: Two times a day (BID) | ORAL | 3 refills | Status: DC

## 2022-07-14 MED ORDER — SENNOSIDES-DOCUSATE SODIUM 8.6-50 MG PO TABS: 2.0000 | ORAL_TABLET | ORAL | Status: DC

## 2022-07-14 MED ORDER — BENZOCAINE-MENTHOL 20-0.5 % EX AERO: 1.0000 | INHALATION_SPRAY | CUTANEOUS | Status: DC | PRN

## 2022-07-14 MED ORDER — DIBUCAINE (PERIANAL) 1 % EX OINT: 1.0000 | TOPICAL_OINTMENT | CUTANEOUS | Status: DC | PRN

## 2022-07-14 MED ORDER — COCONUT OIL OIL: 1.0000 | TOPICAL_OIL | 0 refills | Status: DC | PRN

## 2022-07-14 NOTE — TOC Initial Note (Signed)
Transition of Care [facility]) - Initial/Assessment Note    Patient Details  Name: Story Conti MRN: [id number] Date of Birth: 07-Jan-1991  Transition of Care [facility]) CM/SW Contact:    Allayne Butcher, RN Phone Number: 07/14/2022, 12:39 PM  Clinical Narrative:                 [facility] consult acknowledged for drug exposed newborn.  UDS is negative for infant.  RNCM will follow for cord blood drug detection panel and if positive will make CPS report as required.  No UDS obtained on mother this admission she has tested positive for cannabis/ THC on prenatal visits.  No other drugs detected on previous drug screens.          Patient Goals and CMS Choice        Expected Discharge Plan and Services           Expected Discharge Date: 07/14/22                                    Prior Living Arrangements/Services                       Activities of Daily Living Home Assistive Devices/Equipment: None ADL Screening (condition at time of admission) Patient's cognitive ability adequate to safely complete daily activities?: Yes Is the patient deaf or have difficulty hearing?: No Does the patient have difficulty seeing, even when wearing glasses/contacts?: No Does the patient have difficulty concentrating, remembering, or making decisions?: No Patient able to express need for assistance with ADLs?: Yes Does the patient have difficulty dressing or bathing?: No Independently performs ADLs?: Yes (appropriate for developmental age) Does the patient have difficulty walking or climbing stairs?: No Weakness of Legs: None Weakness of Arms/Hands: None  Permission Sought/Granted                  Emotional Assessment              Admission diagnosis:  Normal labor and delivery [O80] Patient Active Problem List   Diagnosis Date Noted   Normal labor and delivery 07/12/2022   [redacted] weeks gestation of pregnancy 07/12/2022   Group beta Strep positive 06/15/2022    Positive urine drug screen +MJ 01/14/22; +MJ 04/05/22; +UDS MJ 06/01/22 01/24/2022   Supervision of high risk pregnancy, antepartum 01/14/2022   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 [place] [address] Phone: [phone] Fax: [phone]     Social Determinants of Health (SDOH) Interventions    Readmission Risk Interventions     No data to display

## 2022-07-14 NOTE — Progress Notes (Signed)
Pt discharged with infant.  Discharge instructions, prescriptions and follow up appointment given to and reviewed with pt. Pt verbalized understanding. Escorted out by auxillary. 

## 2022-07-15 LAB — TYPE AND SCREEN
ABO/RH(D): O POS
Antibody Screen: POSITIVE
Unit division: 0
Unit division: 0

## 2022-07-15 LAB — BPAM RBC
Blood Product Expiration Date: 202309042359
Blood Product Expiration Date: 202309082359
ISSUE DATE / TIME: 202308061926
Unit Type and Rh: 5100
Unit Type and Rh: 5100

## 2022-07-21 ENCOUNTER — Ambulatory Visit: Payer: Medicaid Other

## 2022-08-19 ENCOUNTER — Ambulatory Visit: Payer: Medicaid Other | Admitting: Nurse Practitioner

## 2022-08-19 ENCOUNTER — Encounter: Payer: Self-pay | Admitting: Nurse Practitioner

## 2022-08-19 ENCOUNTER — Ambulatory Visit: Payer: Medicaid Other

## 2022-08-19 LAB — HEMOGLOBIN, FINGERSTICK: Hemoglobin: 14 g/dL (ref 11.1–15.9)

## 2022-08-19 NOTE — Progress Notes (Addendum)
Infant in carrier with mom at appt. Jossie Ng, RN Hgb = 14.0 and no interventions required per standing order. Jossie Ng, RN

## 2022-08-19 NOTE — Progress Notes (Addendum)
[facility] Department  Postpartum Exam  Cassandra Dixon is a 31 y.o. [phone] female who presents for a postpartum visit. She is 5 weeks postpartum following a normal spontaneous vaginal delivery.  I have fully reviewed the prenatal and intrapartum course. The delivery was at [redacted]w[redacted]d gestational weeks.  Anesthesia: none. Postpartum course has been good. Baby is doing well. Bleeding no bleeding. Bowel function is normal. Bladder function is normal. Patient is not sexually active. Contraception method is  condoms . Postpartum depression screening: negative.   The pregnancy intention screening data noted above was reviewed. Potential methods of contraception were discussed. The patient elected to proceed with Female Condom.    Health Maintenance Due  Topic Date Due   COVID-19 Vaccine (1) Never done   INFLUENZA VACCINE  Never done    The following portions of the patient's history were reviewed and updated as appropriate: allergies, current medications, past family history, past medical history, past social history, past surgical history, and problem list.  Review of Systems All comprehensive review of systems was negative with the exception of weight fluctuation.    Objective:  BP 104/69   Pulse (!) 58   Temp (!) 97.4 F (36.3 C)   Ht 5\' 2"  (1.575 m)   Wt 185 lb 9.6 oz (84.2 kg)   Breastfeeding Yes Comment: Supplements with formula.  BMI 33.95 kg/m    General:  alert, cooperative, and no distress   Breasts:  normal  Lungs: clear to auscultation bilaterally  Heart:  regular rate and rhythm, S1, S2 normal, no murmur, click, rub or gallop  Abdomen: soft, non-tender; bowel sounds normal; no masses,  no organomegaly   Wound No wounds  GU exam:  normal       Assessment:    1. Postpartum exam -31 year old female in clinic today for postpartum visit. -ROS reviewed, no complaints with the exception of weight fluctuation. -Patient has not been sexually active.  Declines STD  screening today. -Desires to use condoms at this time.  Will decide at a later date for a birth control method.    [redacted]w[redacted]d postpartum exam.   Plan:   Essential components of care per ACOG recommendations:  1.  Mood and well being: Patient with negative depression screening today. Reviewed local resources for support.  - Patient tobacco use? No.   - hx of drug use? No.    2. Infant care and feeding:  -Patient currently breastmilk feeding? Yes. Reviewed importance of draining breast regularly to support lactation.  -Social determinants of health (SDOH) reviewed in EPIC. No concerns.  3. Sexuality, contraception and birth spacing - Patient does not want a pregnancy in the next year.  Desired family size is 7 children.  - Reviewed reproductive life planning. Reviewed options based on patient desire and reproductive life plan. Patient is interested in Female Condom. This was provided to the patient today.   Risks, benefits, and typical effectiveness rates were reviewed.  Questions were answered.  Written information was also given to the patient to review.    The patient will follow up in  1 years for surveillance.  The patient was told to call with any further questions, or with any concerns about this method of contraception.  Emphasized use of condoms 100% of the time for STI prevention.  -ECP not offered due to patient not being sexually active.   - Discussed birth spacing of 18 months  4. Sleep and fatigue -Encouraged family/partner/community support of 4 hrs  of uninterrupted sleep to help with mood and fatigue  5. Physical Recovery  - Discussed patients delivery and complications. She describes her labor as good. - Patient had a Vaginal, no problems at delivery.  - Patient has urinary incontinence? No. - Patient is safe to resume physical and sexual activity  6.  Health Maintenance - HM due items addressed Yes - Last pap smear: 08/07/2020, PAP due 07/29/23 -Breast Cancer screening  indicated? No.   7. Chronic Disease/Pregnancy Condition follow up: None  -Hemoglobin today: 14.0  - PCP follow up  Glenna Fellows, FNP

## 2023-02-07 ENCOUNTER — Ambulatory Visit (LOCAL_COMMUNITY_HEALTH_CENTER): Payer: Medicaid Other

## 2023-02-07 VITALS — BP 121/72 | Ht 62.0 in | Wt 180.5 lb

## 2023-02-07 DIAGNOSIS — Z309 Encounter for contraceptive management, unspecified: Secondary | ICD-10-CM

## 2023-02-07 DIAGNOSIS — Z3201 Encounter for pregnancy test, result positive: Secondary | ICD-10-CM

## 2023-02-07 LAB — PREGNANCY, URINE: Preg Test, Ur: POSITIVE — AB

## 2023-02-07 MED ORDER — PRENATAL 27-0.8 MG PO TABS: 1.0000 | ORAL_TABLET | Freq: Every day | ORAL | 0 refills | Status: DC

## 2023-02-07 NOTE — Progress Notes (Signed)
UPT positive. Plans prenatal care at [facility]. To clerk for preadmit.

## 2023-02-24 ENCOUNTER — Ambulatory Visit (INDEPENDENT_AMBULATORY_CARE_PROVIDER_SITE_OTHER): Payer: Medicaid Other

## 2023-02-24 VITALS — Wt 175.0 lb

## 2023-02-24 DIAGNOSIS — Z369 Encounter for antenatal screening, unspecified: Secondary | ICD-10-CM

## 2023-02-24 DIAGNOSIS — Z3480 Encounter for supervision of other normal pregnancy, unspecified trimester: Secondary | ICD-10-CM | POA: Insufficient documentation

## 2023-02-24 DIAGNOSIS — Z3689 Encounter for other specified antenatal screening: Secondary | ICD-10-CM

## 2023-02-24 DIAGNOSIS — Z131 Encounter for screening for diabetes mellitus: Secondary | ICD-10-CM

## 2023-02-24 DIAGNOSIS — Z130 Encounter for screening for diseases of the blood and blood-forming organs and certain disorders involving the immune mechanism: Secondary | ICD-10-CM

## 2023-02-24 NOTE — Patient Instructions (Signed)
First Trimester of Pregnancy  The first trimester of pregnancy starts on the first day of your last menstrual period until the end of week 12. This is also called months 1 through 3 of pregnancy. Body changes during your first trimester Your body goes through many changes during pregnancy. The changes usually return to normal after your baby is born. Physical changes You may gain or lose weight. Your breasts may grow larger and hurt. The area around your nipples may get darker. Dark spots or blotches may develop on your face. You may have changes in your hair. Health changes You may feel like you might vomit (nauseous), and you may vomit. You may have heartburn. You may have headaches. You may have trouble pooping (constipation). Your gums may bleed. Other changes You may get tired easily. You may pee (urinate) more often. Your menstrual periods will stop. You may not feel hungry. You may want to eat certain kinds of food. You may have changes in your emotions from day to day. You may have more dreams. Follow these instructions at home: Medicines Take over-the-counter and prescription medicines only as told by your doctor. Some medicines are not safe during pregnancy. Take a prenatal vitamin that contains at least 600 micrograms (mcg) of folic acid. Eating and drinking Eat healthy meals that include: Fresh fruits and vegetables. Whole grains. Good sources of protein, such as meat, eggs, or tofu. Low-fat dairy products. Avoid raw meat and unpasteurized juice, milk, and cheese. If you feel like you may vomit, or you vomit: Eat 4 or 5 small meals a day instead of 3 large meals. Try eating a few soda crackers. Drink liquids between meals instead of during meals. You may need to take these actions to prevent or treat trouble pooping: Drink enough fluids to keep your pee (urine) pale yellow. Eat foods that are high in fiber. These include beans, whole grains, and fresh fruits and  vegetables. Limit foods that are high in fat and sugar. These include fried or sweet foods. Activity Exercise only as told by your doctor. Most people can do their usual exercise routine during pregnancy. Stop exercising if you have cramps or pain in your lower belly (abdomen) or low back. Do not exercise if it is too hot or too humid, or if you are in a place of great height (high altitude). Avoid heavy lifting. If you choose to, you may have sex unless your doctor tells you not to. Relieving pain and discomfort Wear a good support bra if your breasts are sore. Rest with your legs raised (elevated) if you have leg cramps or low back pain. If you have bulging veins (varicose veins) in your legs: Wear support hose as told by your doctor. Raise your feet for 15 minutes, 3-4 times a day. Limit salt in your food. Safety Wear your seat belt at all times when you are in a car. Talk with your doctor if someone is hurting you or yelling at you. Talk with your doctor if you are feeling sad or have thoughts of hurting yourself. Lifestyle Do not use hot tubs, steam rooms, or saunas. Do not douche. Do not use tampons or scented sanitary pads. Do not use herbal medicines, illegal drugs, or medicines that are not approved by your doctor. Do not drink alcohol. Do not smoke or use any products that contain nicotine or tobacco. If you need help quitting, ask your doctor. Avoid cat litter boxes and soil that is used by cats. These carry   germs that can cause harm to the baby and can cause a loss of your baby by miscarriage or stillbirth. General instructions Keep all follow-up visits. This is important. Ask for help if you need counseling or if you need help with nutrition. Your doctor can give you advice or tell you where to go for help. Visit your dentist. At home, brush your teeth with a soft toothbrush. Floss gently. Write down your questions. Take them to your prenatal visits. Where to find more  information American Pregnancy Association: americanpregnancy.org American College of Obstetricians and Gynecologists: www.acog.org Office on Women's Health: womenshealth.gov/pregnancy Contact a doctor if: You are dizzy. You have a fever. You have mild cramps or pressure in your lower belly. You have a nagging pain in your belly area. You continue to feel like you may vomit, you vomit, or you have watery poop (diarrhea) for 24 hours or longer. You have a bad-smelling fluid coming from your vagina. You have pain when you pee. You are exposed to a disease that spreads from person to person, such as chickenpox, measles, Zika virus, HIV, or hepatitis. Get help right away if: You have spotting or bleeding from your vagina. You have very bad belly cramping or pain. You have shortness of breath or chest pain. You have any kind of injury, such as from a fall or a car crash. You have new or increased pain, swelling, or redness in an arm or leg. Summary The first trimester of pregnancy starts on the first day of your last menstrual period until the end of week 12 (months 1 through 3). Eat 4 or 5 small meals a day instead of 3 large meals. Do not smoke or use any products that contain nicotine or tobacco. If you need help quitting, ask your doctor. Keep all follow-up visits. This information is not intended to replace advice given to you by your health care provider. Make sure you discuss any questions you have with your health care provider. Document Revised: 04/29/2020 Document Reviewed: 03/05/2020 Elsevier Patient Education  2023 [employer] Commonly Asked Questions During Pregnancy  Cats: A parasite can be excreted in cat feces.  To avoid exposure you need to have another person empty the little box.  If you must empty the litter box you will need to wear gloves.  Wash your hands after handling your cat.  This parasite can also be found in raw or undercooked meat so this should also be  avoided.  Colds, Sore Throats, Flu: Please check your medication sheet to see what you can take for symptoms.  If your symptoms are unrelieved by these medications please call the office.  Dental Work: Most any dental work your dentist recommends is permitted.  X-rays should only be taken during the first trimester if absolutely necessary.  Your abdomen should be shielded with a lead apron during all x-rays.  Please notify your provider prior to receiving any x-rays.  Novocaine is fine; gas is not recommended.  If your dentist requires a note from us prior to dental work please call the office and we will provide one for you.  Exercise: Exercise is an important part of staying healthy during your pregnancy.  You may continue most exercises you were accustomed to prior to pregnancy.  Later in your pregnancy you will most likely notice you have difficulty with activities requiring balance like riding a bicycle.  It is important that you listen to your body and avoid activities that put you at a higher   risk of falling.  Adequate rest and staying well hydrated are a must!  If you have questions about the safety of specific activities ask your provider.    Exposure to Children with illness: Try to avoid obvious exposure; report any symptoms to us when noted,  If you have chicken pos, red measles or mumps, you should be immune to these diseases.   Please do not take any vaccines while pregnant unless you have checked with your OB provider.  Fetal Movement: After 28 weeks we recommend you do "kick counts" twice daily.  Lie or sit down in a calm quiet environment and count your baby movements "kicks".  You should feel your baby at least 10 times per hour.  If you have not felt 10 kicks within the first hour get up, walk around and have something sweet to eat or drink then repeat for an additional hour.  If count remains less than 10 per hour notify your provider.  Fumigating: Follow your pest control agent's  advice as to how long to stay out of your home.  Ventilate the area well before re-entering.  Hemorrhoids:   Most over-the-counter preparations can be used during pregnancy.  Check your medication to see what is safe to use.  It is important to use a stool softener or fiber in your diet and to drink lots of liquids.  If hemorrhoids seem to be getting worse please call the office.   Hot Tubs:  Hot tubs Jacuzzis and saunas are not recommended while pregnant.  These increase your internal body temperature and should be avoided.  Intercourse:  Sexual intercourse is safe during pregnancy as long as you are comfortable, unless otherwise advised by your provider.  Spotting may occur after intercourse; report any bright red bleeding that is heavier than spotting.  Labor:  If you know that you are in labor, please go to the hospital.  If you are unsure, please call the office and let us help you decide what to do.  Lifting, straining, etc:  If your job requires heavy lifting or straining please check with your provider for any limitations.  Generally, you should not lift items heavier than that you can lift simply with your hands and arms (no back muscles)  Painting:  Paint fumes do not harm your pregnancy, but may make you ill and should be avoided if possible.  Latex or water based paints have less odor than oils.  Use adequate ventilation while painting.  Permanents & Hair Color:  Chemicals in hair dyes are not recommended as they cause increase hair dryness which can increase hair loss during pregnancy.  " Highlighting" and permanents are allowed.  Dye may be absorbed differently and permanents may not hold as well during pregnancy.  Sunbathing:  Use a sunscreen, as skin burns easily during pregnancy.  Drink plenty of fluids; avoid over heating.  Tanning Beds:  Because their possible side effects are still unknown, tanning beds are not recommended.  Ultrasound Scans:  Routine ultrasounds are performed  at approximately 20 weeks.  You will be able to see your baby's general anatomy an if you would like to know the gender this can usually be determined as well.  If it is questionable when you conceived you may also receive an ultrasound early in your pregnancy for dating purposes.  Otherwise ultrasound exams are not routinely performed unless there is a medical necessity.  Although you can request a scan we ask that you pay for it when   conducted because insurance does not cover " patient request" scans.  Work: If your pregnancy proceeds without complications you may work until your due date, unless your physician or employer advises otherwise.  Round Ligament Pain/Pelvic Discomfort:  Sharp, shooting pains not associated with bleeding are fairly common, usually occurring in the second trimester of pregnancy.  They tend to be worse when standing up or when you remain standing for long periods of time.  These are the result of pressure of certain pelvic ligaments called "round ligaments".  Rest, Tylenol and heat seem to be the most effective relief.  As the womb and fetus grow, they rise out of the pelvis and the discomfort improves.  Please notify the office if your pain seems different than that described.  It may represent a more serious condition.  Common Medications Safe in Pregnancy  Acne:      Constipation:  Benzoyl Peroxide     Colace  Clindamycin      Dulcolax Suppository  Topica Erythromycin     Fibercon  Salicylic Acid      Metamucil         Miralax AVOID:        Senakot   Accutane    Cough:  Retin-A       Cough Drops  Tetracycline      Phenergan w/ Codeine if Rx  Minocycline      Robitussin (Plain & DM)  Antibiotics:     Crabs/Lice:  Ceclor       RID  Cephalosporins    AVOID:  E-Mycins      Kwell  Keflex  Macrobid/Macrodantin   Diarrhea:  Penicillin      Kao-Pectate  Zithromax      Imodium AD         PUSH FLUIDS AVOID:       Cipro     Fever:  Tetracycline      Tylenol (Regular  or Extra  Minocycline       Strength)  Levaquin      Extra Strength-Do not          Exceed 8 tabs/24 hrs Caffeine:        <200mg/day (equiv. To 1 cup of coffee or  approx. 3 12 oz sodas)         Gas: Cold/Hayfever:       Gas-X  Benadryl      Mylicon  Claritin       Phazyme  **Claritin-D        Chlor-Trimeton    Headaches:  Dimetapp      ASA-Free Excedrin  Drixoral-Non-Drowsy     Cold Compress  Mucinex (Guaifenasin)     Tylenol (Regular or Extra  Sudafed/Sudafed-12 Hour     Strength)  **Sudafed PE Pseudoephedrine   Tylenol Cold & Sinus     Vicks Vapor Rub  Zyrtec  **AVOID if Problems With Blood Pressure         Heartburn: Avoid lying down for at least 1 hour after meals  Aciphex      Maalox     Rash:  Milk of Magnesia     Benadryl    Mylanta       1% Hydrocortisone Cream  Pepcid  Pepcid Complete   Sleep Aids:  Prevacid      Ambien   Prilosec       Benadryl  Rolaids       Chamomile Tea  Tums (Limit 4/day)     Unisom           Tylenol PM         Warm milk-add vanilla or  Hemorrhoids:       Sugar for taste  Anusol/Anusol H.C.  (RX: Analapram 2.5%)  Sugar Substitutes:  Hydrocortisone OTC     Ok in moderation  Preparation H      Tucks        Vaseline lotion applied to tissue with wiping    Herpes:     Throat:  Acyclovir      Oragel  Famvir  Valtrex     Vaccines:         Flu Shot Leg Cramps:       *Gardasil  Benadryl      Hepatitis A         Hepatitis B Nasal Spray:       Pneumovax  Saline Nasal Spray     Polio Booster         Tetanus Nausea:       Tuberculosis test or PPD  Vitamin B6 25 mg TID   AVOID:    Dramamine      *Gardasil  Emetrol       Live Poliovirus  Ginger Root 250 mg QID    MMR (measles, mumps &  High Complex Carbs @ Bedtime    rebella)  Sea Bands-Accupressure    Varicella (Chickenpox)  Unisom 1/2 tab TID     *No known complications           If received before Pain:         Known pregnancy;   Darvocet       Resume series  after  Lortab        Delivery  Percocet    Yeast:   Tramadol      Femstat  Tylenol 3      Gyne-lotrimin  Ultram       Monistat  Vicodin           MISC:         All Sunscreens           Hair Coloring/highlights          Insect Repellant's          (Including DEET)         Mystic Tans  

## 2023-02-24 NOTE — Progress Notes (Addendum)
New OB Intake  I connected with  Cassandra Dixon on 02/24/23 at 11:15 AM EDT by telephone and verified that I am speaking with the correct person using two identifiers. Nurse is located at [employer] and pt is located at home.  I explained I am completing New OB Intake today. We discussed her EDD of 10/05/2023 that is based on LMP of 12/29/2022 approx. Pt is [id number]. I reviewed her allergies, medications, Medical/Surgical/OB history, and appropriate screenings. There are cats in the home no. Based on history, this is a/an pregnancy uncomplicated .   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  Anatomy US Explained first scheduled [place] will be scheduled soon and an anatomy scan will be done at 20 weeks.  Labs Discussed genetic screening with patient. Patient declines genetic testing.  Discussed possible labs to be drawn at new OB appointment.  COVID Vaccine Patient has had COVID vaccine.   Social Determinants of Health Food Insecurity: denies food insecurity Transportation: Patient denies transportation needs. Childcare: Discussed no children allowed at ultrasound appointments.   First visit review I reviewed new OB appt with pt. I explained she will have ob bloodwork and pap smear/pelvic exam if indicated. Explained pt will be seen by Gigi Gin, CNM at first visit; encounter  routed to appropriate provider.   Cleophas Dunker, [facility] 02/24/2023  11:36 AM

## 2023-02-27 ENCOUNTER — Telehealth: Payer: Self-pay | Admitting: Obstetrics

## 2023-02-27 ENCOUNTER — Other Ambulatory Visit: Payer: Medicaid Other

## 2023-02-27 NOTE — Telephone Encounter (Signed)
Reached out to pt to reschedule lab appt that was scheduled on 02/27/2023 at 10:20.  Was able to reschedule the appt for 02/28/2023 at 9:40.

## 2023-02-28 ENCOUNTER — Other Ambulatory Visit: Payer: Medicaid Other

## 2023-02-28 NOTE — Telephone Encounter (Signed)
Reached out to pt to reschedule lab appt that was rescheduled for February 28, 2023 at 9:40.  Pt did not come to this appt. Rescheduled the appt for March 01, 2023 at 8:40.

## 2023-03-01 ENCOUNTER — Other Ambulatory Visit: Payer: Medicaid Other

## 2023-03-01 ENCOUNTER — Other Ambulatory Visit (HOSPITAL_COMMUNITY)
Admission: RE | Admit: 2023-03-01 | Discharge: 2023-03-01 | Disposition: A | Discharge status: Home / Self Care | Payer: Medicaid Other | Source: Ambulatory Visit | Attending: Obstetrics | Admitting: Obstetrics

## 2023-03-01 ENCOUNTER — Other Ambulatory Visit: Payer: Self-pay | Admitting: Obstetrics

## 2023-03-01 DIAGNOSIS — Z3480 Encounter for supervision of other normal pregnancy, unspecified trimester: Secondary | ICD-10-CM

## 2023-03-01 DIAGNOSIS — Z131 Encounter for screening for diabetes mellitus: Secondary | ICD-10-CM

## 2023-03-01 DIAGNOSIS — Z369 Encounter for antenatal screening, unspecified: Secondary | ICD-10-CM

## 2023-03-01 DIAGNOSIS — Z130 Encounter for screening for diseases of the blood and blood-forming organs and certain disorders involving the immune mechanism: Secondary | ICD-10-CM

## 2023-03-02 LAB — CBC/D/PLT+RPR+RH+ABO+RUBIGG...
Antibody Screen: NEGATIVE
Basophils Absolute: 0 10*3/uL (ref 0.0–0.2)
Basos: 1 %
EOS (ABSOLUTE): 0.2 10*3/uL (ref 0.0–0.4)
Eos: 4 %
HCV Ab: NONREACTIVE
HIV Screen 4th Generation wRfx: NONREACTIVE
Hematocrit: 38.8 % (ref 34.0–46.6)
Hemoglobin: 13 g/dL (ref 11.1–15.9)
Hepatitis B Surface Ag: NEGATIVE
Immature Grans (Abs): 0 10*3/uL (ref 0.0–0.1)
Immature Granulocytes: 0 %
Lymphocytes Absolute: 1.7 10*3/uL (ref 0.7–3.1)
Lymphs: 29 %
MCH: 30.2 pg (ref 26.6–33.0)
MCHC: 33.5 g/dL (ref 31.5–35.7)
MCV: 90 fL (ref 79–97)
Monocytes Absolute: 0.2 10*3/uL (ref 0.1–0.9)
Monocytes: 3 %
Neutrophils Absolute: 3.6 10*3/uL (ref 1.4–7.0)
Neutrophils: 63 %
Platelets: 270 10*3/uL (ref 150–450)
RBC: 4.3 x10E6/uL (ref 3.77–5.28)
RDW: 12.7 % (ref 11.7–15.4)
RPR Ser Ql: NONREACTIVE
Rh Factor: POSITIVE
Rubella Antibodies, IGG: 4.22 index (ref >0.99)
Varicella zoster IgG: 1978 index (ref >165)
WBC: 5.7 10*3/uL (ref 3.4–10.8)

## 2023-03-02 LAB — URINE CYTOLOGY ANCILLARY ONLY
Chlamydia: NEGATIVE
Comment: NEGATIVE
Comment: NORMAL
Neisseria Gonorrhea: NEGATIVE

## 2023-03-02 LAB — URINALYSIS, ROUTINE W REFLEX MICROSCOPIC
Bilirubin, UA: NEGATIVE
Glucose, UA: NEGATIVE
Ketones, UA: NEGATIVE
Nitrite, UA: NEGATIVE
Protein,UA: NEGATIVE
RBC, UA: NEGATIVE
Specific Gravity, UA: 1.015 (ref 1.005–1.030)
Urobilinogen, Ur: 0.2 mg/dL (ref 0.2–1.0)
pH, UA: 7 (ref 5.0–7.5)

## 2023-03-02 LAB — MICROSCOPIC EXAMINATION
Bacteria, UA: NONE SEEN
Casts: NONE SEEN /lpf
Epithelial Cells (non renal): >10 /hpf — AB (ref 0–10)
WBC, UA: NONE SEEN /hpf (ref 0–5)

## 2023-03-02 LAB — HCV INTERPRETATION

## 2023-03-03 LAB — URINE CULTURE, OB REFLEX

## 2023-03-03 LAB — CULTURE, OB URINE

## 2023-03-06 ENCOUNTER — Other Ambulatory Visit: Payer: Self-pay | Admitting: Obstetrics

## 2023-03-06 ENCOUNTER — Ambulatory Visit
Admission: RE | Admit: 2023-03-06 | Discharge: 2023-03-06 | Disposition: A | Discharge status: Home / Self Care | Payer: Medicaid Other | Source: Ambulatory Visit | Attending: Obstetrics | Admitting: Obstetrics

## 2023-03-06 DIAGNOSIS — Z3A10 10 weeks gestation of pregnancy: Secondary | ICD-10-CM | POA: Insufficient documentation

## 2023-03-06 DIAGNOSIS — Z131 Encounter for screening for diabetes mellitus: Secondary | ICD-10-CM

## 2023-03-06 DIAGNOSIS — Z369 Encounter for antenatal screening, unspecified: Secondary | ICD-10-CM | POA: Insufficient documentation

## 2023-03-06 DIAGNOSIS — Z3480 Encounter for supervision of other normal pregnancy, unspecified trimester: Secondary | ICD-10-CM

## 2023-03-06 DIAGNOSIS — Z130 Encounter for screening for diseases of the blood and blood-forming organs and certain disorders involving the immune mechanism: Secondary | ICD-10-CM

## 2023-03-06 LAB — HEMOGLOBIN A1C
Est. average glucose Bld gHb Est-mCnc: 103 mg/dL
Hgb A1c MFr Bld: 5.2 % (ref 4.8–5.6)

## 2023-03-06 LAB — HGB FRACTIONATION CASCADE
Hgb A2: 2.8 % (ref 1.8–3.2)
Hgb A: 97.2 % (ref 96.4–98.8)
Hgb F: 0 % (ref 0.0–2.0)
Hgb S: 0 %

## 2023-03-06 LAB — SPECIMEN STATUS REPORT

## 2023-03-07 LAB — NICOTINE SCREEN, URINE: Cotinine Ql Scrn, Ur: NEGATIVE ng/mL

## 2023-03-07 LAB — MONITOR DRUG PROFILE 14(MW)
Amphetamine Scrn, Ur: NEGATIVE ng/mL
BARBITURATE SCREEN URINE: NEGATIVE ng/mL
BENZODIAZEPINE SCREEN, URINE: NEGATIVE ng/mL
Buprenorphine, Urine: NEGATIVE ng/mL
Cocaine (Metab) Scrn, Ur: NEGATIVE ng/mL
Creatinine(Crt), U: 81.9 mg/dL (ref 20.0–300.0)
Fentanyl, Urine: NEGATIVE pg/mL
Meperidine Screen, Urine: NEGATIVE ng/mL
Methadone Screen, Urine: NEGATIVE ng/mL
OXYCODONE+OXYMORPHONE UR QL SCN: NEGATIVE ng/mL
Opiate Scrn, Ur: NEGATIVE ng/mL
Ph of Urine: 7 (ref 4.5–8.9)
Phencyclidine Qn, Ur: NEGATIVE ng/mL
Propoxyphene Scrn, Ur: NEGATIVE ng/mL
SPECIFIC GRAVITY: 1.017
Tramadol Screen, Urine: NEGATIVE ng/mL

## 2023-03-07 LAB — CANNABINOID (GC/MS), URINE
Cannabinoid: POSITIVE — AB
Carboxy THC (GC/MS): 20 ng/mL

## 2023-03-22 ENCOUNTER — Encounter: Payer: Self-pay | Admitting: Obstetrics

## 2023-03-22 ENCOUNTER — Other Ambulatory Visit (HOSPITAL_COMMUNITY)
Admission: RE | Admit: 2023-03-22 | Discharge: 2023-03-22 | Disposition: A | Discharge status: Home / Self Care | Payer: Medicaid Other | Source: Ambulatory Visit | Attending: Obstetrics | Admitting: Obstetrics

## 2023-03-22 ENCOUNTER — Ambulatory Visit (INDEPENDENT_AMBULATORY_CARE_PROVIDER_SITE_OTHER): Payer: Medicaid Other | Admitting: Obstetrics

## 2023-03-22 VITALS — BP 109/66 | HR 75 | Wt 181.5 lb

## 2023-03-22 DIAGNOSIS — Z113 Encounter for screening for infections with a predominantly sexual mode of transmission: Secondary | ICD-10-CM | POA: Insufficient documentation

## 2023-03-22 DIAGNOSIS — Z3401 Encounter for supervision of normal first pregnancy, first trimester: Secondary | ICD-10-CM | POA: Diagnosis not present

## 2023-03-22 DIAGNOSIS — O0990 Supervision of high risk pregnancy, unspecified, unspecified trimester: Secondary | ICD-10-CM

## 2023-03-22 DIAGNOSIS — Z124 Encounter for screening for malignant neoplasm of cervix: Secondary | ICD-10-CM | POA: Diagnosis present

## 2023-03-22 DIAGNOSIS — Z3A12 12 weeks gestation of pregnancy: Secondary | ICD-10-CM | POA: Diagnosis not present

## 2023-03-22 LAB — POCT URINALYSIS DIPSTICK OB
Bilirubin, UA: NEGATIVE
Blood, UA: NEGATIVE
Glucose, UA: NEGATIVE
Ketones, UA: NEGATIVE
Leukocytes, UA: NEGATIVE
Nitrite, UA: NEGATIVE
POC,PROTEIN,UA: NEGATIVE
Spec Grav, UA: 1.01 (ref 1.010–1.025)
Urobilinogen, UA: 0.2 E.U./dL
pH, UA: 8 (ref 5.0–8.0)

## 2023-03-22 NOTE — Progress Notes (Signed)
New Obstetric Patient H&P    Chief Complaint: "Desires prenatal care"   History of Present Illness: Patient is a 32 y.o. [id number] Hispanic or Latino female, LMP 12/2022 presents with amenorrhea and positive home pregnancy test. Based on her  LMP, her EDD is Estimated Date of Delivery: 09/30/23 and her EGA is [redacted]w[redacted]d. Cycles are 5. days, regular, and occur approximately every : 28 days. Her last pap smear was 3 years ago and was ASCUS with NEGATIVE high risk HPV.    She had a urine pregnancy test which was positive approximately 2  months  ago. Her last menstrual period was normal and lasted for  5 or 6 day(s). Since her LMP she claims she has experienced breast tenderness, nausea, vomiting, and insomnia. She denies vaginal bleeding. Her past medical history is contibutory. Her prior pregnancies are notable for Group B strep, drug use  Since her LMP, she admits to the use of tobacco products  no She claims she has gained    11  pounds since the start of her pregnancy.  There are cats in the home in the home  no  She admits close contact with children on a regular basis  yes  She has had chicken pox in the past no She has had Tuberculosis exposures, symptoms, or previously tested positive for TB   not applicable Current or past history of domestic violence. no  Genetic Screening/Teratology Counseling: (Includes patient, baby's father, or anyone in either family with:)   1. Patient's age >/= 57 at [facility]  no 2. Thalassemia ([place], [place], Mediterranean, or Asian background): MCV<80  no 3. Neural tube defect (meningomyelocele, spina bifida, anencephaly)  no 4. Congenital heart defect  no  5. Down syndrome  no 6. Tay-Sachs (Jewish, [place]  no 7. Canavan's Disease  no 8. Sickle cell disease or trait (African)  no  9. Hemophilia or other blood disorders  no  10. Muscular dystrophy  no  11. Cystic fibrosis  no  12. Huntington's Chorea  no  13. Mental retardation/autism   no 14. Other inherited genetic or chromosomal disorder  no 15. Maternal metabolic disorder (DM, PKU, etc)  no 16. Patient or FOB with a child with a birth defect not listed above no  16a. Patient or FOB with a birth defect themselves no 17. Recurrent pregnancy loss, or stillbirth  no  18. Any medications since LMP other than prenatal vitamins (include vitamins, supplements, OTC meds, drugs, alcohol)  no 19. Any other genetic/environmental exposure to discuss  no  Infection History:   1. Lives with someone with TB or TB exposed  no  2. Patient or partner has history of genital herpes  no 3. Rash or viral illness since LMP had a cold last week 4. History of STI (GC, CT, HPV, syphilis, HIV)  no 5. History of recent travel :  no  Other pertinent information:  no     Review of Systems:10 point review of systems negative unless otherwise noted in HPI  Past Medical History:  Past Medical History:  Diagnosis Date   Chlamydia infection affecting pregnancy 2017   Patient denies medical problems    Precipitous delivery 07/17/2017   01/13/22 states all deliveries were fast   Precipitous delivery 07/12/2022    Past Surgical History:  Past Surgical History:  Procedure Laterality Date   WISDOM TOOTH EXTRACTION N/A    states all 4 wisdom teeth pulled when she was around 32 years old  Gynecologic History: Patient's last menstrual period was 12/29/2022 (approximate).  Obstetric History: [id number]  Family History:  Family History  Problem Relation Age of Onset   Healthy Mother    Hypertension Father    Diabetes Father    Healthy Brother    Healthy Brother    Healthy Daughter    Healthy Son    Healthy Son    Healthy Maternal Grandmother    Healthy Maternal Grandfather    Healthy Paternal Grandmother    Healthy Paternal Grandfather     Social History:  Social History   Socioeconomic History   Marital status: Married    Spouse name: A J Filkins   Number of children: 4   Years  of education: 12   Highest education level: High school graduate  Occupational History    Comment: unemployed   Occupation: stay at home mom  Tobacco Use   Smoking status: Former    Packs/day: 1.00    Years: 1.00    Additional pack years: 0.00    Total pack years: 1.00    Types: Cigarettes    Passive exposure: Past (around friends)   Smokeless tobacco: Never   Tobacco comments:    Stopped smoking cigarettes while in early 20's per client.  Vaping Use   Vaping Use: Never used  Substance and Sexual Activity   Alcohol use: Not Currently    Comment: Last ETOH use in 2022.   Drug use: Not Currently    Types: Marijuana    Comment: Last marijuana use July 2022.   Sexual activity: Yes    Partners: Male    Birth control/protection: None  Other Topics Concern   Not on file  Social History Narrative   01/13/22 Per phone interview pt states her husband and children are in [place] where her husband has a job.  She wants to return to [place] and then come back here in May to continue prenantal care and bring her children. FOB of current pregnancy is not father of other children.    Currently lives with her mother and says has other family here too. Henriette Combs RN   Social Determinants of Health   Financial Resource Strain: Low Risk  (02/24/2023)   Overall Financial Resource Strain (CARDIA)    Difficulty of Paying Living Expenses: Not hard at all  Food Insecurity: No Food Insecurity (02/24/2023)   Hunger Vital Sign    Worried About Running Out of Food in the Last Year: Never true    Ran Out of Food in the Last Year: Never true  Transportation Needs: No Transportation Needs (01/14/2022)   PRAPARE - Administrator, Civil Service (Medical): No    Lack of Transportation (Non-Medical): No  Physical Activity: Sufficiently Active (02/24/2023)   Exercise Vital Sign    Days of Exercise per Week: 3 days    Minutes of Exercise per Session: 60 min  Stress: No Stress Concern Present  (02/24/2023)   [employer] of Occupational Health - Occupational Stress Questionnaire    Feeling of Stress : Not at all  Social Connections: Moderately Integrated (02/24/2023)   Social Connection and Isolation Panel [NHANES]    Frequency of Communication with Friends and Family: More than three times a week    Frequency of Social Gatherings with Friends and Family: Twice a week    Attends Religious Services: 1 to 4 times per year    Active Member of [employer] or Organizations: No    Attends Banker  Meetings: Never    Marital Status: Married  Catering manager Violence: Not At Risk (02/24/2023)   Humiliation, Afraid, Rape, and Kick questionnaire    Fear of Current or Ex-Partner: No    Emotionally Abused: No    Physically Abused: No    Sexually Abused: No    Allergies:  Allergies  Allergen Reactions   Shrimp [Shellfish Allergy] Shortness Of Breath and Swelling    Medications: Prior to Admission medications   Medication Sig Start Date End Date Taking? Authorizing Provider  Prenatal Vit-Fe Fumarate-FA (PRENATAL MULTIVITAMIN) TABS tablet Take 1 tablet by mouth daily at 12 noon. 01/18/22  Yes Sciora, Austin Miles, CNM    Physical Exam Vitals: Blood pressure 109/66, pulse 75, weight 181 lb 8 oz (82.3 kg), last menstrual period 12/29/2022, currently breastfeeding.  General: NAD HEENT: normocephalic, anicteric Thyroid: no enlargement, no palpable nodules Pulmonary: No increased work of breathing, CTAB Cardiovascular: RRR, distal pulses 2+ Abdomen: NABS, soft, non-tender, non-distended.  Umbilicus without lesions.  No hepatomegaly, splenomegaly or masses palpable. No evidence of hernia  Genitourinary:  External: Normal external female genitalia.  Normal urethral meatus, normal  Bartholin's and Skene's glands.    Vagina: Normal vaginal mucosa, no evidence of prolapse.    Cervix: Grossly normal in appearance, bleeding post pap smear  Uterus: gravid, mobile, normal contour.   No CMT  Adnexa: ovaries non-enlarged, no adnexal masses  Rectal: deferred Extremities: no edema, erythema, or tenderness Neurologic: Grossly intact Psychiatric: mood appropriate, affect full   Assessment: 32 y.o. [id number] at [redacted]w[redacted]d presenting to initiate prenatal care  Plan: 1) Avoid alcoholic beverages. 2) Patient encouraged not to smoke.  3) Discontinue the use of all non-medicinal drugs and chemicals.  4) Take prenatal vitamins daily.  5) Nutrition, food safety (fish, cheese advisories, and high nitrite foods) and exercise discussed. 6) Hospital and practice style discussed with cross coverage system.  7) Genetic Screening, such as with 1st Trimester Screening, cell free fetal DNA, AFP testing, and Ultrasound, as well as with amniocentesis and CVS as appropriate, is discussed with patient. At the conclusion of today's visit patient requested genetic testing 8) Patient is asked about travel to areas at risk for the Zika virus, and counseled to avoid travel and exposure to mosquitoes or sexual partners who may have themselves been exposed to the virus. Testing is discussed, and will be ordered as appropriate.   Patient having nausea, insomnia and and a burning sensation in her stomach relieved by drinking carbonated beverages. Also concerned about a cold sore on her left lower lip; instructed to start Abreva OTC and to reach back out if it doesn't go away and/or she continues to get them. Discussed using unisom and vit. B6 for insomnia and N/V. Occasional headaches, discussed Tylenol. Pap done and pelvic exam done; patient tolerated well.   The following were addressed during this visit:  Breastfeeding Education - Early initiation of breastfeeding    Comments: Keeps milk supply adequate, helps contract uterus and slow bleeding, and early milk is the perfect first food and is easy to digest.   - The importance of exclusive breastfeeding    Comments: Provides antibodies, Lower risk of  breast and ovarian cancers, and type-2 diabetes,Helps your body recover, Reduced chance of SIDS.   - Risks of giving your baby anything other than breast milk if you are breastfeeding    Comments: Make the baby less content with breastfeeds, may make my baby more susceptible to illness, and may reduce my milk supply.   -  Nonpharmacological pain relief methods for labor    Comments: Deep breathing, focusing on pleasant things, movement and walking, heating pads or cold compress, massage and relaxation, continuous support from someone you trust, and Doulas   - The importance of early skin-to-skin contact    Comments:  Keeps baby warm and secure, helps keep baby's blood sugar up and breathing steady, easier to bond and breastfeed, and helps calm baby.  - Rooming-in on a 24-hour basis    Comments: Easier to learn baby's feeding cues, easier to bond and get to know each other, and encourages milk production.   - Feeding on demand or baby-led feeding    Comments: Helps prevent breastfeeding complications, helps bring in good milk supply, prevents under or overfeeding, and helps baby feel content and satisfied   - Frequent feeding to help assure optimal milk production    Comments: Making a full supply of milk requires frequent removal of milk from breasts, infant will eat 8-12 times in 24 hours, if separated from infant use breast massage, hand expression and/ or pumping to remove milk from breasts.   - Effective positioning and attachment    Comments: Helps my baby to get enough breast milk, helps to produce an adequate milk supply, and helps prevent nipple pain and damage   - Exclusive breastfeeding for the first 6 months    Comments: Builds a healthy milk supply and keeps it up, protects baby from sickness and disease, and breastmilk has everything your baby needs for the first 6 months.  - Individualized Education    Comments: Contraindications to breastfeeding and other special  medical conditions    RTC in 4 weeks approx. On 04/21/23.  Earlie Counts. SNM Mirna Mires, CNM  03/22/2023 4:32 PM

## 2023-03-24 LAB — CERVICOVAGINAL ANCILLARY ONLY
Bacterial Vaginitis (gardnerella): POSITIVE — AB
Chlamydia: NEGATIVE
Comment: NEGATIVE
Comment: NEGATIVE
Comment: NEGATIVE
Comment: NORMAL
Neisseria Gonorrhea: NEGATIVE
Trichomonas: NEGATIVE

## 2023-03-27 ENCOUNTER — Encounter: Payer: Self-pay | Admitting: Obstetrics

## 2023-03-27 ENCOUNTER — Other Ambulatory Visit: Payer: Self-pay | Admitting: Obstetrics

## 2023-03-27 DIAGNOSIS — N760 Acute vaginitis: Secondary | ICD-10-CM

## 2023-03-27 DIAGNOSIS — O0990 Supervision of high risk pregnancy, unspecified, unspecified trimester: Secondary | ICD-10-CM

## 2023-03-27 LAB — CYTOLOGY - PAP
Comment: NEGATIVE
Diagnosis: NEGATIVE
High risk HPV: NEGATIVE

## 2023-03-27 LAB — MATERNIT 21 PLUS CORE, BLOOD
Fetal Fraction: 7
Result (T21): NEGATIVE
Trisomy 13 (Patau syndrome): NEGATIVE
Trisomy 18 (Edwards syndrome): NEGATIVE
Trisomy 21 (Down syndrome): NEGATIVE

## 2023-03-27 MED ORDER — METRONIDAZOLE 500 MG PO TABS: 500.0000 mg | ORAL_TABLET | Freq: Two times a day (BID) | ORAL | 0 refills | Status: AC

## 2023-04-19 ENCOUNTER — Encounter: Payer: Self-pay | Admitting: Obstetrics

## 2023-04-19 ENCOUNTER — Ambulatory Visit (INDEPENDENT_AMBULATORY_CARE_PROVIDER_SITE_OTHER): Payer: Medicaid Other | Admitting: Obstetrics

## 2023-04-19 VITALS — BP 102/64 | HR 65

## 2023-04-19 DIAGNOSIS — R825 Elevated urine levels of drugs, medicaments and biological substances: Secondary | ICD-10-CM

## 2023-04-19 DIAGNOSIS — F1290 Cannabis use, unspecified, uncomplicated: Secondary | ICD-10-CM

## 2023-04-19 DIAGNOSIS — O99322 Drug use complicating pregnancy, second trimester: Secondary | ICD-10-CM

## 2023-04-19 DIAGNOSIS — Z3A16 16 weeks gestation of pregnancy: Secondary | ICD-10-CM

## 2023-04-19 DIAGNOSIS — O0990 Supervision of high risk pregnancy, unspecified, unspecified trimester: Secondary | ICD-10-CM

## 2023-04-19 LAB — POCT URINALYSIS DIPSTICK OB
Bilirubin, UA: NEGATIVE
Blood, UA: NEGATIVE
Glucose, UA: NEGATIVE
Ketones, UA: NEGATIVE
Leukocytes, UA: NEGATIVE
Nitrite, UA: NEGATIVE
Spec Grav, UA: 1.01 (ref 1.010–1.025)
Urobilinogen, UA: 0.2 E.U./dL
pH, UA: 7 (ref 5.0–8.0)

## 2023-04-19 NOTE — Progress Notes (Signed)
ROB at [redacted]w[redacted]d.  Devonna is feeling well but does not have much of an appetite. Discussed eating small, frequent, nutrient-dense snacks. Anticipatory guidance about second trimester. Anatomy scan ordered. RTC in 4 weeks.  Glenetta Borg, CNM

## 2023-05-17 ENCOUNTER — Ambulatory Visit (INDEPENDENT_AMBULATORY_CARE_PROVIDER_SITE_OTHER): Payer: Medicaid Other | Admitting: Certified Nurse Midwife

## 2023-05-17 ENCOUNTER — Encounter: Payer: Self-pay | Admitting: Certified Nurse Midwife

## 2023-05-17 ENCOUNTER — Ambulatory Visit
Admission: RE | Admit: 2023-05-17 | Discharge: 2023-05-17 | Disposition: A | Discharge status: Home / Self Care | Payer: Medicaid Other | Source: Ambulatory Visit | Attending: Obstetrics | Admitting: Obstetrics

## 2023-05-17 ENCOUNTER — Ambulatory Visit: Payer: Medicaid Other

## 2023-05-17 VITALS — BP 102/63 | HR 64 | Wt 183.7 lb

## 2023-05-17 DIAGNOSIS — O0990 Supervision of high risk pregnancy, unspecified, unspecified trimester: Secondary | ICD-10-CM | POA: Diagnosis present

## 2023-05-17 DIAGNOSIS — Z3482 Encounter for supervision of other normal pregnancy, second trimester: Secondary | ICD-10-CM

## 2023-05-17 DIAGNOSIS — Z3A20 20 weeks gestation of pregnancy: Secondary | ICD-10-CM

## 2023-05-17 DIAGNOSIS — O0991 Supervision of high risk pregnancy, unspecified, first trimester: Secondary | ICD-10-CM

## 2023-05-17 LAB — POCT URINALYSIS DIPSTICK OB
Bilirubin, UA: NEGATIVE
Blood, UA: NEGATIVE
Glucose, UA: NEGATIVE
Ketones, UA: NEGATIVE
Leukocytes, UA: NEGATIVE
Nitrite, UA: NEGATIVE
POC,PROTEIN,UA: NEGATIVE
Spec Grav, UA: 1.01 (ref 1.010–1.025)
Urobilinogen, UA: 0.2 E.U./dL
pH, UA: 6.5 (ref 5.0–8.0)

## 2023-05-17 NOTE — Progress Notes (Signed)

## 2023-05-17 NOTE — Patient Instructions (Signed)
Round Ligament Pain  The round ligaments are a pair of cord-like tissues that help support the uterus. They can become a source of pain during pregnancy as the ligaments soften and stretch as the baby grows. The pain usually begins in the second trimester (13-28 weeks) of pregnancy, and should only last for a few seconds when it occurs. However, the pain can come and go until the baby is delivered. The pain does not cause harm to the baby. Round ligament pain is usually a short, sharp, and pinching pain, but it can also be a dull, lingering, and aching pain. The pain is felt in the lower side of the abdomen or in the groin. It usually starts deep in the groin and moves up to the outside of the hip area. The pain may happen when you: Suddenly change position, such as quickly going from a sitting to standing position. Do physical activity. Cough or sneeze. Follow these instructions at home: Managing pain  When the pain starts, relax. Then, try any of these methods to help with the pain: Sit down. Flex your knees up to your abdomen. Lie on your side with one pillow under your abdomen and another pillow between your legs. Sit in a warm bath for 15-20 minutes or until the pain goes away. General instructions Watch your condition for any changes. Move slowly when you sit down or stand up. Stop or reduce your physical activities if they cause pain. Avoid long walks if they cause pain. Take over-the-counter and prescription medicines only as told by your health care provider. Keep all follow-up visits. This is important. Contact a health care provider if: Your pain does not go away with treatment. You feel pain in your back that you did not have before. Your medicine is not helping. You have a fever or chills. You have nausea or vomiting. You have diarrhea. You have pain when you urinate. Get help right away if: You have pain that is a rhythmic, cramping pain similar to labor pains. Labor  pains are usually 2 minutes apart, last for about 1 minute, and involve a bearing down feeling or pressure in your pelvis. You have vaginal bleeding. These symptoms may represent a serious problem that is an emergency. Do not wait to see if the symptoms will go away. Get medical help right away. Call your local emergency services (911 in the [place] Do not drive yourself to the hospital. Summary Round ligament pain is felt in the lower abdomen or groin. This pain usually begins in the second trimester (13-28 weeks) and should only last for a few seconds when it occurs. You may notice the pain when you suddenly change position, when you cough or sneeze, or during physical activity. Relaxing, flexing your knees to your abdomen, lying on one side, or taking a warm bath may help to get rid of the pain. Contact your health care provider if the pain does not go away. This information is not intended to replace advice given to you by your health care provider. Make sure you discuss any questions you have with your health care provider. Document Revised: 02/03/2021 Document Reviewed: 02/03/2021 Elsevier Patient Education  2024 Elsevier Inc.  

## 2023-05-17 NOTE — Progress Notes (Signed)
ROB doing well, not feeling movement yet. Reassurnace given. Pt has anatomy scan scheduled for this afternoon. She denies any other concerns. Follow up for u/s ( this afternoon) and ROB in 4 wks.   Doreene Burke, CNM

## 2023-05-18 MED ORDER — PRENATAL MULTIVITAMIN CH: 1.0000 | ORAL_TABLET | Freq: Every day | ORAL | 3 refills | Status: DC

## 2023-05-18 NOTE — Addendum Note (Signed)
Addended by: Mechele Claude on: 05/18/2023 07:46 AM   Modules accepted: Orders

## 2023-06-14 ENCOUNTER — Ambulatory Visit (INDEPENDENT_AMBULATORY_CARE_PROVIDER_SITE_OTHER): Payer: Medicaid Other

## 2023-06-14 VITALS — BP 114/80 | Wt 187.0 lb

## 2023-06-14 DIAGNOSIS — Z3482 Encounter for supervision of other normal pregnancy, second trimester: Secondary | ICD-10-CM

## 2023-06-14 DIAGNOSIS — Z3A24 24 weeks gestation of pregnancy: Secondary | ICD-10-CM

## 2023-06-14 DIAGNOSIS — O0991 Supervision of high risk pregnancy, unspecified, first trimester: Secondary | ICD-10-CM

## 2023-06-14 DIAGNOSIS — Z3480 Encounter for supervision of other normal pregnancy, unspecified trimester: Secondary | ICD-10-CM

## 2023-06-14 MED ORDER — PRENATAL MULTIVITAMIN CH: 1.0000 | ORAL_TABLET | Freq: Every day | ORAL | 3 refills | Status: DC

## 2023-06-14 NOTE — Progress Notes (Signed)
    Return Prenatal Note   Assessment/Plan   Plan  32 y.o. [id number] at [redacted]w[redacted]d presents for follow-up OB visit. Reviewed prenatal record including previous visit note.  Supervision of other normal pregnancy, antepartum Anatomy ultrasound completed but results not released for review at this time.  Prepared for 28 week labs, GCT, and Tdap at next visit. Planning to travel to [place] in next month or so to visit husband who is currently working there. She has traveled before in previous pregnancies and is aware of travel precautions. Recommended that she complete her 28 week labs prior to leaving.  Refilled PNV prescription. Reviewed red flag warning signs anticipatory guidance for upcoming prenatal care.    No orders of the defined types were placed in this encounter.  Return in about 1 week (around 06/21/2023) for ROB with 1 hour glucola.   Future Appointments  Date Time Provider Department Center  07/05/2023 10:55 AM Doreene Burke, CNM [facility] None    For next visit:  ROB with 1 hour gluocla, third trimester labs, and Tdap     Subjective   31 y.o. [id number] at [redacted]w[redacted]d presents for this follow-up prenatal visit.  Patient has been experiencing more sweating between thighs with this pregnancy than previous pregnancies. Patient reports: Movement: Present Contractions: Not present  Objective   Flow sheet Vitals: BP: 114/80 Fundal Height: 24 cm Fetal Heart Rate (bpm): 145 Total weight gain: 17 lb (7.711 kg)  General Appearance  No acute distress, well appearing, and well nourished Pulmonary   Normal work of breathing Neurologic   Alert and oriented to person, place, and time Psychiatric   Mood and affect within normal limits  Lindalou Hose Kariem Wolfson, CNM  06/13/2410:33 AM

## 2023-06-14 NOTE — Assessment & Plan Note (Signed)
Anatomy ultrasound completed but results not released for review at this time.  Prepared for 28 week labs, GCT, and Tdap at next visit. Planning to travel to [place] in next month or so to visit husband who is currently working there. She has traveled before in previous pregnancies and is aware of travel precautions. Recommended that she complete her 28 week labs prior to leaving.  Refilled PNV prescription. Reviewed red flag warning signs anticipatory guidance for upcoming prenatal care.

## 2023-06-15 ENCOUNTER — Other Ambulatory Visit: Payer: Self-pay

## 2023-06-15 DIAGNOSIS — O0991 Supervision of high risk pregnancy, unspecified, first trimester: Secondary | ICD-10-CM

## 2023-06-15 MED ORDER — PRENATAL MULTIVITAMIN CH: 1.0000 | ORAL_TABLET | Freq: Every day | ORAL | 3 refills | Status: AC

## 2023-07-05 ENCOUNTER — Encounter: Payer: Self-pay | Admitting: Certified Nurse Midwife

## 2023-07-05 ENCOUNTER — Ambulatory Visit (INDEPENDENT_AMBULATORY_CARE_PROVIDER_SITE_OTHER): Payer: Medicaid Other | Admitting: Certified Nurse Midwife

## 2023-07-05 VITALS — BP 105/69 | HR 89 | Wt 188.3 lb

## 2023-07-05 DIAGNOSIS — Z23 Encounter for immunization: Secondary | ICD-10-CM

## 2023-07-05 DIAGNOSIS — Z3A27 27 weeks gestation of pregnancy: Secondary | ICD-10-CM | POA: Diagnosis not present

## 2023-07-05 DIAGNOSIS — Z3482 Encounter for supervision of other normal pregnancy, second trimester: Secondary | ICD-10-CM | POA: Diagnosis not present

## 2023-07-05 NOTE — Progress Notes (Signed)
ROB doing well. Feels good movement. 28 wk labs today: Glucose screen/RPR/CBC. Tdap given, Blood transfusion consent completed, all questions answered. Sample birth plan given, will follow up in upcoming visits. Discussed birth control after delivery, information pamphlet given.   Follow up 2 wk  for ROB or sooner if needed.   Pt states she will be going to [place] at the end of August to be with her husband. It is likely that she will stay there until after she delivers. She will continue to come to visit until she leaves.   Doreene Burke, CNM

## 2023-07-05 NOTE — Patient Instructions (Signed)
Third Trimester of Pregnancy  The third trimester of pregnancy is from week 28 through week 40. This is also called months 7 through 9. This trimester is when your unborn baby (fetus) is growing very fast. At the end of the ninth month, the unborn baby is about 20 inches long. It weighs about 6-10 pounds. Body changes during your third trimester Your body continues to go through many changes during this time. The changes vary and generally return to normal after the baby is born. Physical changes Your weight will continue to increase. You may gain 25-35 pounds (11-16 kg) by the end of the pregnancy. If you are underweight, you may gain 28-40 lb (about 13-18 kg). If you are overweight, you may gain 15-25 lb (about 7-11 kg). You may start to get stretch marks on your hips, belly (abdomen), and breasts. Your breasts will continue to grow and may hurt. A yellow fluid (colostrum) may leak from your breasts. This is the first milk you are making for your baby. You may have changes in your hair. Your belly button may stick out. You may have more swelling in your hands, face, or ankles. Health changes You may have heartburn. You may have trouble pooping (constipation). You may get hemorrhoids. These are swollen veins in the butt that can itch or get painful. You may have swollen veins (varicose veins) in your legs. You may have more body aches in the pelvis, back, or thighs. You may have more tingling or numbness in your hands, arms, and legs. The skin on your belly may also feel numb. You may feel short of breath as your womb (uterus) gets bigger. Other changes You may pee (urinate) more often. You may have more problems sleeping. You may notice the unborn baby "dropping," or moving lower in your belly. You may have more discharge coming from your vagina. Your joints may feel loose, and you may have pain around your pelvic bone. Follow these instructions at home: Medicines Take over-the-counter  and prescription medicines only as told by your doctor. Some medicines are not safe during pregnancy. Take a prenatal vitamin that contains at least 600 micrograms (mcg) of folic acid. Eating and drinking Eat healthy meals that include: Fresh fruits and vegetables. Whole grains. Good sources of protein, such as meat, eggs, or tofu. Low-fat dairy products. Avoid raw meat and unpasteurized juice, milk, and cheese. These carry germs that can harm you and your baby. Eat 4 or 5 small meals rather than 3 large meals a day. You may need to take these actions to prevent or treat trouble pooping: Drink enough fluids to keep your pee (urine) pale yellow. Eat foods that are high in fiber. These include beans, whole grains, and fresh fruits and vegetables. Limit foods that are high in fat and sugar. These include fried or sweet foods. Activity Exercise only as told by your doctor. Stop exercising if you start to have cramps in your womb. Avoid heavy lifting. Do not exercise if it is too hot or too humid, or if you are in a place of great height (high altitude). If you choose to, you may have sex unless your doctor tells you not to. Relieving pain and discomfort Take breaks often, and rest with your legs raised (elevated) if you have leg cramps or low back pain. Take warm water baths (sitz baths) to soothe pain or discomfort caused by hemorrhoids. Use hemorrhoid cream if your doctor approves. Wear a good support bra if your breasts are  tender. If you develop bulging, swollen veins in your legs: Wear support hose as told by your doctor. Raise your feet for 15 minutes, 3-4 times a day. Limit salt in your food. Safety Talk to your doctor before traveling far distances. Do not use hot tubs, steam rooms, or saunas. Wear your seat belt at all times when you are in a car. Talk with your doctor if someone is hurting you or yelling at you a lot. Preparing for your baby's arrival To prepare for the arrival  of your baby: Take prenatal classes. Visit the hospital and tour the maternity area. Buy a rear-facing car seat. Learn how to install it in your car. Prepare the baby's room. Take out all pillows and stuffed animals from the baby's crib. General instructions Avoid cat litter boxes and soil used by cats. These carry germs that can cause harm to the baby and can cause a loss of your baby by miscarriage or stillbirth. Do not douche or use tampons. Do not use scented sanitary pads. Do not smoke or use any products that contain nicotine or tobacco. If you need help quitting, ask your doctor. Do not drink alcohol. Do not use herbal medicines, illegal drugs, or medicines that were not approved by your doctor. Chemicals in these products can affect your baby. Keep all follow-up visits. This is important. Where to find more information American Pregnancy Association: americanpregnancy.org [employer]: www.acog.org Office on Women's Health: [employer] Contact a doctor if: You have a fever. You have mild cramps or pressure in your lower belly. You have a nagging pain in your belly area. You vomit, or you have watery poop (diarrhea). You have bad-smelling fluid coming from your vagina. You have pain when you pee, or your pee smells bad. You have a headache that does not go away when you take medicine. You have changes in how you see, or you see spots in front of your eyes. Get help right away if: Your water breaks. You have regular contractions that are less than 5 minutes apart. You are spotting or bleeding from your vagina. You have very bad belly cramps or pain. You have trouble breathing. You have chest pain. You faint. You have not felt the baby move for the amount of time told by your doctor. You have new or increased pain, swelling, or redness in an arm or leg. Summary The third trimester is from week 28 through week 40 (months 7  through 9). This is the time when your unborn baby is growing very fast. During this time, your discomfort may increase as you gain weight and as your baby grows. Get ready for your baby to arrive by taking prenatal classes, buying a rear-facing car seat, and preparing the baby's room. Get help right away if you are bleeding from your vagina, you have chest pain and trouble breathing, or you have not felt the baby move for the amount of time told by your doctor. This information is not intended to replace advice given to you by your health care provider. Make sure you discuss any questions you have with your health care provider. Document Revised: 04/29/2020 Document Reviewed: 03/05/2020 Elsevier Patient Education  2024 [employer].

## 2023-07-18 ENCOUNTER — Telehealth: Payer: Self-pay | Admitting: Certified Nurse Midwife

## 2023-07-18 ENCOUNTER — Other Ambulatory Visit: Payer: Self-pay

## 2023-07-18 DIAGNOSIS — Z362 Encounter for other antenatal screening follow-up: Secondary | ICD-10-CM

## 2023-07-18 NOTE — Telephone Encounter (Addendum)
I have contacted the patient via phone. The patient needs an Anatomy scan ASAP. I reaching out to the patient, the call was disconnected. I attempted an second time and the phone rang and rang. I wasn't able to left voicemail advising the patient to call back.  I was able to offer Friday, 8/16 at 11:15 am for ultrasound the patient has an office visit scheduled for 8/16 for an Routine visit with Paula Compton. NO DPR signed on file.

## 2023-07-19 NOTE — Telephone Encounter (Signed)
I contacted the patient via phone. No answer, voicemail is not set up. I am unable to let patient know of ultrasound added to 07/21/23 at 11:15 am.

## 2023-07-20 ENCOUNTER — Other Ambulatory Visit: Payer: Medicaid Other

## 2023-07-21 ENCOUNTER — Ambulatory Visit: Payer: Medicaid Other

## 2023-07-21 ENCOUNTER — Encounter: Payer: Medicaid Other | Admitting: Licensed Practical Nurse

## 2023-07-21 ENCOUNTER — Ambulatory Visit (INDEPENDENT_AMBULATORY_CARE_PROVIDER_SITE_OTHER): Payer: Medicaid Other | Admitting: Obstetrics

## 2023-07-21 VITALS — BP 100/60 | HR 83 | Wt 194.0 lb

## 2023-07-21 DIAGNOSIS — F1290 Cannabis use, unspecified, uncomplicated: Secondary | ICD-10-CM

## 2023-07-21 DIAGNOSIS — Z3480 Encounter for supervision of other normal pregnancy, unspecified trimester: Secondary | ICD-10-CM

## 2023-07-21 DIAGNOSIS — Z362 Encounter for other antenatal screening follow-up: Secondary | ICD-10-CM | POA: Diagnosis not present

## 2023-07-21 DIAGNOSIS — Z3A29 29 weeks gestation of pregnancy: Secondary | ICD-10-CM

## 2023-07-21 DIAGNOSIS — O99320 Drug use complicating pregnancy, unspecified trimester: Secondary | ICD-10-CM

## 2023-07-21 DIAGNOSIS — O99323 Drug use complicating pregnancy, third trimester: Secondary | ICD-10-CM

## 2023-07-21 LAB — POCT URINALYSIS DIPSTICK OB
Bilirubin, UA: NEGATIVE
Blood, UA: NEGATIVE
Glucose, UA: NEGATIVE
Ketones, UA: 1
Nitrite, UA: NEGATIVE
Urobilinogen, UA: 0.2 E.U./dL
pH, UA: 7.5 (ref 5.0–8.0)

## 2023-07-21 NOTE — Progress Notes (Signed)
Routine Prenatal Care Visit  Subjective  Cassandra Dixon is a 32 y.o. [phone] at [redacted]w[redacted]d being seen today for ongoing prenatal care.  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  ----------------------------------------------------------------------------------- Patient reports no complaints.  She presents with all  four children for her late anatomy scan today. Strong odor of MJ in the exam room.she informs staff that she is moving to [place] in 2 weeks. Needs a copy of her records. She has not set up care in [place] yet. Contractions: Not present. Vag. Bleeding: None.  Movement: Present. Leaking Fluid denies.  ----------------------------------------------------------------------------------- The following portions of the patient's history were reviewed and updated as appropriate: allergies, current medications, past family history, past medical history, past social history, past surgical history and problem list. Problem list updated.  Objective  Blood pressure 100/60, pulse 83, weight 194 lb (88 kg), last menstrual period 12/29/2022, currently breastfeeding. Pregravid weight 170 lb (77.1 kg) Total Weight Gain 24 lb (10.9 kg) Urinalysis: Urine Protein    Urine Glucose    Fetal Status:     Movement: Present     General:  Alert, oriented and cooperative. Patient is in no acute distress.  Skin: Skin is warm and dry. No rash noted.   Cardiovascular: Normal heart rate noted  Respiratory: Normal respiratory effort, no problems with respiration noted  Abdomen: Soft, gravid, appropriate for gestational age. Pain/Pressure: Absent     Pelvic:  Cervical exam deferred        Extremities:  Normal range of motion.     Mental Status: Normal mood and affect. Normal behavior. Normal judgment and thought content.   Assessment   32 y.o. [id number] at [redacted]w[redacted]d by  09/30/2023, by Ultrasound presenting for routine prenatal visit Patient is moving to [place], and will leave [facility] care. MJ use in pregnancy.  Plan   six Problems (from 02/24/23 to present)     Problem Noted Resolved   Supervision of other normal pregnancy, antepartum 02/24/2023 by Loran Senters, CMA No   Overview Addendum 07/21/2023 10:32 AM by Donnetta Hail, CMA     Clinical Staff Provider  Office Location  [facility] Dating  Not found.  Language  English Anatomy US    Flu Vaccine  offer Genetic Screen  NIPS: Low risk, XY  TDaP vaccine  07/05/23 Hgb A1C or  GTT Early : A1c 5.2 Third trimester :   Covid No boosters   LAB RESULTS   Rhogam  O/Positive/-- (03/27 1002)  Blood Type O/Positive/-- (03/27 1002)   Feeding Plan breast Antibody Negative (03/27 1002)  Contraception undecided Rubella 4.22 (03/27 1002)  Circumcision no RPR Non Reactive (03/27 1002)   Pediatrician  [facility] HBsAg Negative (03/27 1002)   Support Person Ajay, Mom HIV Non Reactive (03/27 1002)  Prenatal Classes no Varicella Immune    GBS Positive/-- (07/12 1140)(For PCN allergy, check sensitivities)   BTL Consent  Hep C Non Reactive (03/27 1002)   VBAC Consent  Pap Diagnosis  Date Value Ref Range Status  03/22/2023   Final   - Negative for intraepithelial lesion or malignancy (NILM)      Hgb Electro  normal    CF      SMA  Preterm labor symptoms and general obstetric precautions including but not limited to vaginal bleeding, contractions, leaking of fluid and fetal movement were reviewed in detail with the patient. Please refer to After Visit Summary for other counseling recommendations.   Advised to get a copy of her prenatal records. Consent for this done. Advised to stop using marijuana during this  pregnancy and to establish care in [place].  Cassandra Dixon, CNM  07/21/2023 1:18 PM

## 2023-07-25 LAB — URINE DRUG PANEL 7
Amphetamines, Urine: NEGATIVE ng/mL
Barbiturate Quant, Ur: NEGATIVE ng/mL
Benzodiazepine Quant, Ur: NEGATIVE ng/mL
Cannabinoid Quant, Ur: POSITIVE — AB
Cocaine (Metab.): NEGATIVE ng/mL
Opiate Quant, Ur: NEGATIVE ng/mL
PCP Quant, Ur: NEGATIVE ng/mL
